# Patient Record
Sex: Female | Born: 1945 | Race: Black or African American | Hispanic: No | Smoking: Never smoker
Health system: Southern US, Community
[De-identification: ages and names within clinical notes are randomized; demographics above are authoritative.]

## PROBLEM LIST (undated history)

## (undated) DIAGNOSIS — I10 Essential (primary) hypertension: Secondary | ICD-10-CM

## (undated) DIAGNOSIS — I2699 Other pulmonary embolism without acute cor pulmonale: Secondary | ICD-10-CM

## (undated) DIAGNOSIS — I82409 Acute embolism and thrombosis of unspecified deep veins of unspecified lower extremity: Secondary | ICD-10-CM

## (undated) DIAGNOSIS — M1710 Unilateral primary osteoarthritis, unspecified knee: Secondary | ICD-10-CM

## (undated) HISTORY — PX: CHOLECYSTECTOMY: SHX55

## (undated) HISTORY — PX: BREAST SURGERY: SHX581

## (undated) HISTORY — PX: HEMORRHOID SURGERY: SHX153

---

## 2008-10-28 ENCOUNTER — Ambulatory Visit (HOSPITAL_BASED_OUTPATIENT_CLINIC_OR_DEPARTMENT_OTHER): Admission: RE | Admit: 2008-10-28 | Discharge: 2008-10-28 | Payer: Self-pay | Admitting: Internal Medicine

## 2008-10-28 ENCOUNTER — Ambulatory Visit: Payer: Self-pay | Admitting: Diagnostic Radiology

## 2009-04-15 ENCOUNTER — Ambulatory Visit: Payer: Self-pay | Admitting: Obstetrics and Gynecology

## 2009-04-16 ENCOUNTER — Encounter: Payer: Self-pay | Admitting: Obstetrics & Gynecology

## 2009-09-02 ENCOUNTER — Ambulatory Visit: Payer: Self-pay | Admitting: Diagnostic Radiology

## 2009-09-02 ENCOUNTER — Emergency Department (HOSPITAL_BASED_OUTPATIENT_CLINIC_OR_DEPARTMENT_OTHER): Admission: EM | Admit: 2009-09-02 | Discharge: 2009-09-02 | Payer: Self-pay | Admitting: Emergency Medicine

## 2010-10-28 ENCOUNTER — Other Ambulatory Visit: Payer: Self-pay | Admitting: Family Medicine

## 2010-10-28 ENCOUNTER — Encounter: Payer: Medicare Other | Admitting: Family Medicine

## 2010-10-28 DIAGNOSIS — Z124 Encounter for screening for malignant neoplasm of cervix: Secondary | ICD-10-CM

## 2010-10-28 DIAGNOSIS — B373 Candidiasis of vulva and vagina: Secondary | ICD-10-CM

## 2010-10-29 NOTE — Group Therapy Note (Signed)
NAME:  Cassandra [name], Cassandra [name] NO.:  [id number]  MEDICAL RECORD NO.:  [id number]           PATIENT TYPE:  A  LOCATION:  [facility]                   FACILITY:  [facility]  PHYSICIAN:  Tinnie Gens, MD        DATE OF BIRTH:  August 29, 1945  DATE OF SERVICE:  10/28/2010                                 CLINIC NOTE  CHIEF COMPLAINT:  Pap and vaginal irritation.  HISTORY OF PRESENT ILLNESS:  The patient is a 65 year old gravida 3, para 2-0-1-2 who is hypertensive and is self-referred.  She was previously referred in from [facility] Department after findings of cervical polyp.  This cervical polyp was not found by Dr. Okey Dupre. Today, she is reporting some vaginal dryness and irritation that has been going on for several months.  She does not take baths and she had recent blood work at her primary care physician that does not indicate she had any evidence of diabetes.  She notes no external changes except for she sometimes gets boils and she had one recently but seems to be healing well on its own.  She has noted no other pigmented changes.  She does have some mild itching related to it.  She wonders if she is in need of HRT.  PHYSICAL EXAMINATION:  VITAL SIGNS:  Today, vitals are as noted in the chart. GENERAL:  She is a well-developed, well-nourished female in no acute distress. ABDOMEN:  Soft, nontender, and nondistended. GU:  Normal external female genitalia.  BUS is normal.  Vagina is pink and rugated.  There is a thick adherent cottage cheese yellow-white discharge noted in the vagina.  The cervix appears to be menopausal and is small.  There is no evidence of polyp noted.  Uterus and adnexa exams were limited by body habitus.  IMPRESSION: 1. Probable Monilia yeast. 2. Pap smear obtained.  PLAN: 1. We will treat with Diflucan 150 mg 1 p.o. x1, repeat in 24 hours if     needed.  The patient will return as needed. 2. I have explained to the patient that at age 58, the  patient should     no longer require Pap smears as long as this was normal.  The     results will be back in several weeks and we will mail her the     results of this.          ______________________________ Tinnie Gens, MD    TP/MEDQ  D:  10/28/2010  T:  10/29/2010  Job:  [id number]

## 2011-02-12 ENCOUNTER — Emergency Department (HOSPITAL_BASED_OUTPATIENT_CLINIC_OR_DEPARTMENT_OTHER)
Admission: EM | Admit: 2011-02-12 | Discharge: 2011-02-12 | Disposition: A | Discharge status: Home / Self Care | Payer: Medicare Other | Attending: Emergency Medicine | Admitting: Emergency Medicine

## 2011-02-12 ENCOUNTER — Encounter: Payer: Self-pay | Admitting: *Deleted

## 2011-02-12 ENCOUNTER — Emergency Department (INDEPENDENT_AMBULATORY_CARE_PROVIDER_SITE_OTHER): Payer: Medicare Other

## 2011-02-12 DIAGNOSIS — M549 Dorsalgia, unspecified: Secondary | ICD-10-CM | POA: Insufficient documentation

## 2011-02-12 DIAGNOSIS — R109 Unspecified abdominal pain: Secondary | ICD-10-CM | POA: Insufficient documentation

## 2011-02-12 DIAGNOSIS — N949 Unspecified condition associated with female genital organs and menstrual cycle: Secondary | ICD-10-CM

## 2011-02-12 DIAGNOSIS — N390 Urinary tract infection, site not specified: Secondary | ICD-10-CM | POA: Insufficient documentation

## 2011-02-12 HISTORY — DX: Essential (primary) hypertension: I10

## 2011-02-12 LAB — DIFFERENTIAL
Eosinophils Absolute: 0.1 10*3/uL (ref 0.0–0.7)
Lymphs Abs: 1.4 10*3/uL (ref 0.7–4.0)
Monocytes Absolute: 0.7 10*3/uL (ref 0.1–1.0)
Neutro Abs: 5 10*3/uL (ref 1.7–7.7)

## 2011-02-12 LAB — BASIC METABOLIC PANEL
BUN: 10 mg/dL (ref 6–23)
Chloride: 100 mEq/L (ref 96–112)
Creatinine, Ser: 1 mg/dL (ref 0.50–1.10)
GFR calc Af Amer: 67 mL/min — ABNORMAL LOW (ref >90)
GFR calc non Af Amer: 57 mL/min — ABNORMAL LOW (ref >90)
Potassium: 3.5 mEq/L (ref 3.5–5.1)

## 2011-02-12 LAB — URINALYSIS, ROUTINE W REFLEX MICROSCOPIC
Ketones, ur: NEGATIVE mg/dL
Leukocytes, UA: NEGATIVE
Nitrite: NEGATIVE
pH: 6.5 (ref 5.0–8.0)

## 2011-02-12 LAB — CBC
HCT: 36.5 % (ref 36.0–46.0)
Hemoglobin: 12.6 g/dL (ref 12.0–15.0)
MCH: 30.7 pg (ref 26.0–34.0)
MCHC: 34.5 g/dL (ref 30.0–36.0)
MCV: 88.8 fL (ref 78.0–100.0)
RBC: 4.11 MIL/uL (ref 3.87–5.11)
RDW: 12.7 % (ref 11.5–15.5)
WBC: 7.1 10*3/uL (ref 4.0–10.5)

## 2011-02-12 LAB — URINE MICROSCOPIC-ADD ON

## 2011-02-12 MED ORDER — CEFTRIAXONE SODIUM 1 G IJ SOLR
1.0000 g | INTRAMUSCULAR | Status: DC
  Administered 2011-02-12: 1 g via INTRAMUSCULAR
  Filled 2011-02-12: qty 1

## 2011-02-12 MED ORDER — LIDOCAINE HCL (PF) 1 % IJ SOLN
INTRAMUSCULAR | Status: AC
  Administered 2011-02-12: 2.1 mL via INTRAMUSCULAR
  Filled 2011-02-12: qty 5

## 2011-02-12 MED ORDER — CEPHALEXIN 500 MG PO CAPS: 500.0000 mg | ORAL_CAPSULE | Freq: Four times a day (QID) | ORAL | Status: AC

## 2011-02-12 MED ORDER — OXYCODONE-ACETAMINOPHEN 5-325 MG PO TABS: 2.0000 | ORAL_TABLET | ORAL | Status: AC | PRN

## 2011-02-12 NOTE — ED Provider Notes (Signed)
History     CSN: [id number] Arrival date & time: 02/12/2011  7:58 AM  Chief Complaint  Patient presents with  . Abdominal Pain    (Consider location/radiation/quality/duration/timing/severity/associated sxs/prior treatment) Patient is a 65 y.o. [name] presenting with abdominal pain. The history is provided by the patient.  Abdominal Pain The primary symptoms of the illness include abdominal pain.   Patient here complaining of lower abdominal pain x3 days. Was seen by her physician for same diagnosed with a UTI and placed on ciprofloxacin 2 days ago. Continues to note dysuria, she denies fever or vomiting does note some flank pain. Nothing makes her symptoms better or worse. Patient's pain starts in her suprapubic region and radiates to her flank. No past medical history on file.  No past surgical history on file.  No family history on file.  History  Substance Use Topics  . Smoking status: Not on file  . Smokeless tobacco: Not on file  . Alcohol Use: Not on file    OB History    No data available      Review of Systems  Gastrointestinal: Positive for abdominal pain.  All other systems reviewed and are negative.    Allergies  Review of patient's allergies indicates not on file.  Home Medications  No current outpatient prescriptions on file.  There were no vitals taken for this visit.  Physical Exam  Nursing note and vitals reviewed. Constitutional: She is oriented to person, place, and time. She appears well-developed and well-nourished.  Non-toxic appearance.  HENT:  Head: Normocephalic and atraumatic.  Eyes: Conjunctivae are normal. Pupils are equal, round, and reactive to light.  Neck: Normal range of motion.  Cardiovascular: Normal rate.   Pulmonary/Chest: Effort normal.  Abdominal: Soft. There is tenderness in the suprapubic area. There is no rigidity, no rebound, no guarding and no CVA tenderness.  Musculoskeletal: Normal range of motion.  Neurological:  She is alert and oriented to person, place, and time.  Skin: Skin is warm and dry.  Psychiatric: She has a normal mood and affect.    ED Course  Procedures (including critical care time)   Labs Reviewed  CBC  DIFFERENTIAL  BASIC METABOLIC PANEL  URINALYSIS, ROUTINE W REFLEX MICROSCOPIC  URINE CULTURE   No results found.   No diagnosis found.    MDM  Ct Abdomen Pelvis Wo Contrast  02/12/2011  *RADIOLOGY REPORT*  Clinical Data: 65 year old [name] with right flank, back and abdominal/pelvic pain.  CT ABDOMEN AND PELVIS WITHOUT CONTRAST  Technique:  Multidetector CT imaging of the abdomen and pelvis was performed following the standard protocol without intravenous contrast.  Comparison: None  Findings: Calcifications along the posterior liver capsule are noted and probably related to prior inflammation or trauma. No other peritoneal abnormalities or masses are identified.  The spleen, pancreas and adrenal glands are unremarkable.  Mild fullness of both intrarenal collecting systems identified with mild ureteral wall thickening and adjacent inflammation.  There is no evidence of obstructing cause and this is suspicious for infection. Low density lesions within both kidneys measure fluid density likely representing cysts. There is no evidence of urinary calculi.  Cholecystectomy identified.  Please note that parenchymal abnormalities may be missed as intravenous contrast was not administered. No free fluid, enlarged lymph nodes, biliary dilation or abdominal aortic aneurysm identified. The bowel and bladder are within normal limits.  No acute or suspicious bony abnormalities are noted. Degenerative changes within both hips are present.  IMPRESSION: Fullness of both intrarenal collecting systems  with mild bilateral ureteral wall thickening and periureteral inflammation suggestive of infection.  No evidence of urinary calculi.  Original Report Authenticated By: Rosendo Gros, M.D.     Results  for orders placed during the hospital encounter of 02/12/11  CBC      Component Value Range   WBC 7.1  4.0 - 10.5 (K/uL)   RBC 4.11  3.87 - 5.11 (MIL/uL)   Hemoglobin 12.6  12.0 - 15.0 (g/dL)   HCT 40.1  02.7 - 25.3 (%)   MCV 88.8  78.0 - 100.0 (fL)   MCH 30.7  26.0 - 34.0 (pg)   MCHC 34.5  30.0 - 36.0 (g/dL)   RDW 66.4  40.3 - 47.4 (%)   Platelets 239  150 - 400 (K/uL)  DIFFERENTIAL      Component Value Range   Neutrophils Relative 70  43 - 77 (%)   Neutro Abs 5.0  1.7 - 7.7 (K/uL)   Lymphocytes Relative 19  12 - 46 (%)   Lymphs Abs 1.4  0.7 - 4.0 (K/uL)   Monocytes Relative 10  3 - 12 (%)   Monocytes Absolute 0.7  0.1 - 1.0 (K/uL)   Eosinophils Relative 1  0 - 5 (%)   Eosinophils Absolute 0.1  0.0 - 0.7 (K/uL)   Basophils Relative 0  0 - 1 (%)   Basophils Absolute 0.0  0.0 - 0.1 (K/uL)  BASIC METABOLIC PANEL      Component Value Range   Sodium 138  135 - 145 (mEq/L)   Potassium 3.5  3.5 - 5.1 (mEq/L)   Chloride 100  96 - 112 (mEq/L)   CO2 29  19 - 32 (mEq/L)   Glucose, Bld 130 (*) 70 - 99 (mg/dL)   BUN 10  6 - 23 (mg/dL)   Creatinine, Ser 2.59  0.50 - 1.10 (mg/dL)   Calcium 9.5  8.4 - 56.3 (mg/dL)   GFR calc non Af Amer 57 (*) >90 (mL/min)   GFR calc Af Amer 67 (*) >90 (mL/min)  URINALYSIS, ROUTINE W REFLEX MICROSCOPIC      Component Value Range   Color, Urine YELLOW  YELLOW    Appearance CLOUDY (*) CLEAR    Specific Gravity, Urine 1.008  1.005 - 1.030    pH 6.5  5.0 - 8.0    Glucose, UA NEGATIVE  NEGATIVE (mg/dL)   Hgb urine dipstick MODERATE (*) NEGATIVE    Bilirubin Urine NEGATIVE  NEGATIVE    Ketones, ur NEGATIVE  NEGATIVE (mg/dL)   Protein, ur 875 (*) NEGATIVE (mg/dL)   Urobilinogen, UA 0.2  0.0 - 1.0 (mg/dL)   Nitrite NEGATIVE  NEGATIVE    Leukocytes, UA NEGATIVE  NEGATIVE   URINE MICROSCOPIC-ADD ON      Component Value Range   Squamous Epithelial / LPF FEW (*) RARE    WBC, UA 0-2  <3 (WBC/hpf)   RBC / HPF 3-6  <3 (RBC/hpf)   Bacteria, UA FEW (*) RARE       Patient had an abdominal CT which showed infection of her urinary tract system. Patient denies vomiting fever here will be given Rocephin 1 g IM will change her prescription to Keflex and she will followup with her doctor as needed or return here if she is unable      Toy Baker, MD 02/12/11 [phone]

## 2011-02-12 NOTE — ED Notes (Signed)
Patient states that she went to primary MD last week for abd/lower back pain and was diagnosed with UTI, placed on cipro but still continues to experience pain, burns when she urinates

## 2011-02-13 LAB — URINE CULTURE
Colony Count: NO GROWTH
Culture  Setup Time: 201210071139
Culture: NO GROWTH

## 2011-06-09 ENCOUNTER — Other Ambulatory Visit (HOSPITAL_BASED_OUTPATIENT_CLINIC_OR_DEPARTMENT_OTHER): Payer: Self-pay | Admitting: Internal Medicine

## 2011-06-09 DIAGNOSIS — R1011 Right upper quadrant pain: Secondary | ICD-10-CM

## 2011-06-15 ENCOUNTER — Ambulatory Visit (HOSPITAL_BASED_OUTPATIENT_CLINIC_OR_DEPARTMENT_OTHER)
Admission: RE | Admit: 2011-06-15 | Discharge: 2011-06-15 | Disposition: A | Discharge status: Home / Self Care | Payer: Medicare Other | Source: Ambulatory Visit | Attending: Internal Medicine | Admitting: Internal Medicine

## 2011-06-15 DIAGNOSIS — R1011 Right upper quadrant pain: Secondary | ICD-10-CM

## 2011-06-15 MED ORDER — IOHEXOL 300 MG/ML  SOLN: 100.0000 mL | Freq: Once | INTRAMUSCULAR | Status: AC | PRN

## 2015-04-12 ENCOUNTER — Inpatient Hospital Stay (HOSPITAL_BASED_OUTPATIENT_CLINIC_OR_DEPARTMENT_OTHER)
Admission: EM | Admit: 2015-04-12 | Discharge: 2015-04-20 | DRG: 270 | Disposition: A | Discharge status: Home Health | Payer: Medicare Other | Attending: Internal Medicine | Admitting: Internal Medicine

## 2015-04-12 ENCOUNTER — Emergency Department (HOSPITAL_BASED_OUTPATIENT_CLINIC_OR_DEPARTMENT_OTHER): Payer: Medicare Other

## 2015-04-12 ENCOUNTER — Encounter (HOSPITAL_BASED_OUTPATIENT_CLINIC_OR_DEPARTMENT_OTHER): Payer: Self-pay | Admitting: Emergency Medicine

## 2015-04-12 ENCOUNTER — Ambulatory Visit (HOSPITAL_COMMUNITY): Payer: Medicare Other

## 2015-04-12 DIAGNOSIS — I749 Embolism and thrombosis of unspecified artery: Secondary | ICD-10-CM | POA: Diagnosis not present

## 2015-04-12 DIAGNOSIS — Y838 Other surgical procedures as the cause of abnormal reaction of the patient, or of later complication, without mention of misadventure at the time of the procedure: Secondary | ICD-10-CM | POA: Diagnosis not present

## 2015-04-12 DIAGNOSIS — I771 Stricture of artery: Secondary | ICD-10-CM

## 2015-04-12 DIAGNOSIS — R609 Edema, unspecified: Secondary | ICD-10-CM | POA: Diagnosis present

## 2015-04-12 DIAGNOSIS — D62 Acute posthemorrhagic anemia: Secondary | ICD-10-CM | POA: Diagnosis not present

## 2015-04-12 DIAGNOSIS — Q211 Atrial septal defect: Secondary | ICD-10-CM | POA: Diagnosis not present

## 2015-04-12 DIAGNOSIS — Z8249 Family history of ischemic heart disease and other diseases of the circulatory system: Secondary | ICD-10-CM

## 2015-04-12 DIAGNOSIS — I509 Heart failure, unspecified: Secondary | ICD-10-CM | POA: Diagnosis not present

## 2015-04-12 DIAGNOSIS — N189 Chronic kidney disease, unspecified: Secondary | ICD-10-CM | POA: Diagnosis present

## 2015-04-12 DIAGNOSIS — Z79899 Other long term (current) drug therapy: Secondary | ICD-10-CM | POA: Diagnosis not present

## 2015-04-12 DIAGNOSIS — I10 Essential (primary) hypertension: Secondary | ICD-10-CM | POA: Diagnosis present

## 2015-04-12 DIAGNOSIS — I97638 Postprocedural hematoma of a circulatory system organ or structure following other circulatory system procedure: Secondary | ICD-10-CM | POA: Diagnosis not present

## 2015-04-12 DIAGNOSIS — Z833 Family history of diabetes mellitus: Secondary | ICD-10-CM | POA: Diagnosis not present

## 2015-04-12 DIAGNOSIS — I272 Other secondary pulmonary hypertension: Secondary | ICD-10-CM | POA: Diagnosis present

## 2015-04-12 DIAGNOSIS — E876 Hypokalemia: Secondary | ICD-10-CM | POA: Diagnosis present

## 2015-04-12 DIAGNOSIS — R52 Pain, unspecified: Secondary | ICD-10-CM

## 2015-04-12 DIAGNOSIS — I50811 Acute right heart failure: Secondary | ICD-10-CM

## 2015-04-12 DIAGNOSIS — M179 Osteoarthritis of knee, unspecified: Secondary | ICD-10-CM | POA: Diagnosis present

## 2015-04-12 DIAGNOSIS — N179 Acute kidney failure, unspecified: Secondary | ICD-10-CM | POA: Diagnosis present

## 2015-04-12 DIAGNOSIS — I7092 Chronic total occlusion of artery of the extremities: Secondary | ICD-10-CM | POA: Diagnosis not present

## 2015-04-12 DIAGNOSIS — M6289 Other specified disorders of muscle: Secondary | ICD-10-CM | POA: Diagnosis not present

## 2015-04-12 DIAGNOSIS — I82432 Acute embolism and thrombosis of left popliteal vein: Principal | ICD-10-CM | POA: Diagnosis present

## 2015-04-12 DIAGNOSIS — I2699 Other pulmonary embolism without acute cor pulmonale: Secondary | ICD-10-CM | POA: Diagnosis present

## 2015-04-12 DIAGNOSIS — I97621 Postprocedural hematoma of a circulatory system organ or structure following other procedure: Secondary | ICD-10-CM | POA: Diagnosis not present

## 2015-04-12 DIAGNOSIS — I739 Peripheral vascular disease, unspecified: Secondary | ICD-10-CM | POA: Diagnosis present

## 2015-04-12 DIAGNOSIS — Z6841 Body Mass Index (BMI) 40.0 and over, adult: Secondary | ICD-10-CM | POA: Diagnosis not present

## 2015-04-12 DIAGNOSIS — I420 Dilated cardiomyopathy: Secondary | ICD-10-CM | POA: Diagnosis present

## 2015-04-12 DIAGNOSIS — I745 Embolism and thrombosis of iliac artery: Secondary | ICD-10-CM | POA: Diagnosis present

## 2015-04-12 DIAGNOSIS — R739 Hyperglycemia, unspecified: Secondary | ICD-10-CM | POA: Diagnosis present

## 2015-04-12 DIAGNOSIS — Z419 Encounter for procedure for purposes other than remedying health state, unspecified: Secondary | ICD-10-CM

## 2015-04-12 DIAGNOSIS — I2609 Other pulmonary embolism with acute cor pulmonale: Secondary | ICD-10-CM | POA: Diagnosis not present

## 2015-04-12 DIAGNOSIS — I998 Other disorder of circulatory system: Secondary | ICD-10-CM | POA: Diagnosis not present

## 2015-04-12 HISTORY — DX: Unilateral primary osteoarthritis, unspecified knee: M17.10

## 2015-04-12 LAB — BASIC METABOLIC PANEL
ANION GAP: 12 (ref 5–15)
BUN: 14 mg/dL (ref 6–20)
CALCIUM: 8.8 mg/dL — AB (ref 8.9–10.3)
CHLORIDE: 101 mmol/L (ref 101–111)
CO2: 20 mmol/L — AB (ref 22–32)
CREATININE: 1.36 mg/dL — AB (ref 0.44–1.00)
GFR calc non Af Amer: 38 mL/min — ABNORMAL LOW (ref >60)
GFR, EST AFRICAN AMERICAN: 45 mL/min — AB (ref >60)
Glucose, Bld: 185 mg/dL — ABNORMAL HIGH (ref 65–99)
Potassium: 3.2 mmol/L — ABNORMAL LOW (ref 3.5–5.1)
SODIUM: 133 mmol/L — AB (ref 135–145)

## 2015-04-12 LAB — TROPONIN I
TROPONIN I: 0.09 ng/mL — AB (ref <0.031)
TROPONIN I: 0.07 ng/mL — AB (ref <0.031)

## 2015-04-12 LAB — CBC WITH DIFFERENTIAL/PLATELET
BASOS ABS: 0 10*3/uL (ref 0.0–0.1)
BASOS PCT: 0 %
EOS ABS: 0.1 10*3/uL (ref 0.0–0.7)
Eosinophils Relative: 1 %
HEMATOCRIT: 36.1 % (ref 36.0–46.0)
HEMOGLOBIN: 12.4 g/dL (ref 12.0–15.0)
Lymphocytes Relative: 14 %
Lymphs Abs: 1.4 10*3/uL (ref 0.7–4.0)
MCH: 30.8 pg (ref 26.0–34.0)
MCHC: 34.3 g/dL (ref 30.0–36.0)
MCV: 89.6 fL (ref 78.0–100.0)
MONOS PCT: 8 %
Monocytes Absolute: 0.8 10*3/uL (ref 0.1–1.0)
NEUTROS ABS: 7.6 10*3/uL (ref 1.7–7.7)
NEUTROS PCT: 77 %
Platelets: 141 10*3/uL — ABNORMAL LOW (ref 150–400)
RBC: 4.03 MIL/uL (ref 3.87–5.11)
RDW: 13.6 % (ref 11.5–15.5)
WBC: 9.9 10*3/uL (ref 4.0–10.5)

## 2015-04-12 LAB — GLUCOSE, CAPILLARY
Glucose-Capillary: 103 mg/dL — ABNORMAL HIGH (ref 65–99)
Glucose-Capillary: 137 mg/dL — ABNORMAL HIGH (ref 65–99)
Glucose-Capillary: 95 mg/dL (ref 65–99)

## 2015-04-12 LAB — PHOSPHORUS: PHOSPHORUS: 3.3 mg/dL (ref 2.5–4.6)

## 2015-04-12 LAB — HEPARIN LEVEL (UNFRACTIONATED): HEPARIN UNFRACTIONATED: 1.16 [IU]/mL — AB (ref 0.30–0.70)

## 2015-04-12 LAB — D-DIMER, QUANTITATIVE (NOT AT ARMC): D DIMER QUANT: 14.52 ug{FEU}/mL — AB (ref 0.00–0.50)

## 2015-04-12 LAB — MAGNESIUM: MAGNESIUM: 1.5 mg/dL — AB (ref 1.7–2.4)

## 2015-04-12 LAB — MRSA PCR SCREENING: MRSA BY PCR: NEGATIVE

## 2015-04-12 MED ORDER — SODIUM CHLORIDE 0.9 % IJ SOLN
3.0000 mL | Freq: Two times a day (BID) | INTRAMUSCULAR | Status: DC
  Administered 2015-04-12 – 2015-04-20 (×10): 3 mL via INTRAVENOUS

## 2015-04-12 MED ORDER — HEPARIN (PORCINE) IN NACL 100-0.45 UNIT/ML-% IJ SOLN
700.0000 [IU]/h | INTRAMUSCULAR | Status: DC
  Administered 2015-04-12: 1000 [IU]/h via INTRAVENOUS
  Filled 2015-04-12: qty 250

## 2015-04-12 MED ORDER — ONDANSETRON HCL 4 MG/2ML IJ SOLN: 4.0000 mg | Freq: Four times a day (QID) | INTRAMUSCULAR | Status: DC | PRN

## 2015-04-12 MED ORDER — MORPHINE SULFATE (PF) 4 MG/ML IV SOLN
4.0000 mg | Freq: Once | INTRAVENOUS | Status: AC
  Administered 2015-04-12: 4 mg via INTRAVENOUS
  Filled 2015-04-12: qty 1

## 2015-04-12 MED ORDER — CETYLPYRIDINIUM CHLORIDE 0.05 % MT LIQD
7.0000 mL | Freq: Two times a day (BID) | OROMUCOSAL | Status: DC
  Administered 2015-04-12 – 2015-04-20 (×13): 7 mL via OROMUCOSAL

## 2015-04-12 MED ORDER — BENAZEPRIL HCL 20 MG PO TABS
40.0000 mg | ORAL_TABLET | Freq: Every day | ORAL | Status: DC
  Administered 2015-04-12 – 2015-04-13 (×2): 40 mg via ORAL
  Filled 2015-04-12 (×7): qty 2

## 2015-04-12 MED ORDER — HEPARIN BOLUS VIA INFUSION
4000.0000 [IU] | Freq: Once | INTRAVENOUS | Status: AC
  Administered 2015-04-12: 4000 [IU] via INTRAVENOUS

## 2015-04-12 MED ORDER — OXYCODONE-ACETAMINOPHEN 5-325 MG PO TABS
1.0000 | ORAL_TABLET | Freq: Once | ORAL | Status: AC
  Administered 2015-04-12: 1 via ORAL
  Filled 2015-04-12: qty 1

## 2015-04-12 MED ORDER — ACETAMINOPHEN 650 MG RE SUPP: 650.0000 mg | Freq: Four times a day (QID) | RECTAL | Status: DC | PRN

## 2015-04-12 MED ORDER — MAGNESIUM SULFATE 2 GM/50ML IV SOLN
2.0000 g | Freq: Once | INTRAVENOUS | Status: AC
  Administered 2015-04-12: 2 g via INTRAVENOUS
  Filled 2015-04-12: qty 50

## 2015-04-12 MED ORDER — SODIUM CHLORIDE 0.9 % IV SOLN
INTRAVENOUS | Status: DC
  Administered 2015-04-12 – 2015-04-13 (×2): 75 mL/h via INTRAVENOUS
  Administered 2015-04-13 – 2015-04-16 (×6): via INTRAVENOUS

## 2015-04-12 MED ORDER — ONDANSETRON HCL 4 MG PO TABS: 4.0000 mg | ORAL_TABLET | Freq: Four times a day (QID) | ORAL | Status: DC | PRN

## 2015-04-12 MED ORDER — SODIUM CHLORIDE 0.9 % IV BOLUS (SEPSIS)
1000.0000 mL | Freq: Once | INTRAVENOUS | Status: AC
  Administered 2015-04-12: 1000 mL via INTRAVENOUS

## 2015-04-12 MED ORDER — OXYCODONE HCL 5 MG PO TABS
5.0000 mg | ORAL_TABLET | ORAL | Status: DC | PRN
  Administered 2015-04-13 – 2015-04-14 (×3): 5 mg via ORAL
  Filled 2015-04-12 (×4): qty 1

## 2015-04-12 MED ORDER — IOHEXOL 350 MG/ML SOLN
100.0000 mL | Freq: Once | INTRAVENOUS | Status: AC | PRN
  Administered 2015-04-12: 100 mL via INTRAVENOUS

## 2015-04-12 MED ORDER — HEPARIN (PORCINE) IN NACL 100-0.45 UNIT/ML-% IJ SOLN
1250.0000 [IU]/h | INTRAMUSCULAR | Status: DC
  Administered 2015-04-12: 1250 [IU]/h via INTRAVENOUS
  Filled 2015-04-12: qty 250

## 2015-04-12 MED ORDER — ACETAMINOPHEN 325 MG PO TABS
650.0000 mg | ORAL_TABLET | Freq: Four times a day (QID) | ORAL | Status: DC | PRN
  Administered 2015-04-20: 650 mg via ORAL
  Filled 2015-04-12: qty 2

## 2015-04-12 MED ORDER — POTASSIUM CHLORIDE CRYS ER 20 MEQ PO TBCR
40.0000 meq | EXTENDED_RELEASE_TABLET | Freq: Two times a day (BID) | ORAL | Status: DC
  Administered 2015-04-12 – 2015-04-14 (×5): 40 meq via ORAL
  Filled 2015-04-12 (×5): qty 2

## 2015-04-12 NOTE — ED Notes (Signed)
MD at bedside. 

## 2015-04-12 NOTE — ED Notes (Signed)
Patient transported to CT 

## 2015-04-12 NOTE — Progress Notes (Signed)
ANTICOAGULATION CONSULT NOTE - Initial Consult  Pharmacy Consult for heparin Indication: pulmonary embolus  No Known Allergies  Patient Measurements: Height: 5\' 3"  (160 cm) Weight: 240 lb (108.863 kg) IBW/kg (Calculated) : 52.4 Heparin Dosing Weight: 85kg  Vital Signs: Temp: 98.6 F (37 C) (12/05 0129) Temp Source: Oral (12/05 0129) BP: 124/82 mmHg (12/05 0400) Pulse Rate: 76 (12/05 0400)  Labs:  Recent Labs  04/12/15 0230  HGB 12.4  HCT 36.1  PLT 141*  CREATININE 1.36*    Estimated Creatinine Clearance: 45.6 mL/min (by C-G formula based on Cr of 1.36).   Medical History: Past Medical History  Diagnosis Date  . Hypertension     Assessment: 69yo female c/o LLE/hip pain/numbness and SOB x3d, CXR at PCP was negative, D-dimer found to be elevated, CT shows multiple proximal blood clots per EDP, to begin heparin.  Goal of Therapy:  Heparin level 0.3-0.7 units/ml Monitor platelets by anticoagulation protocol: Yes   Plan:  Will give heparin 4000 units IV bolus x1 followed by gtt at 1250 units/hr and monitor heparin levels and CBC.  Vernard GamblesVeronda Naje Rice, PharmD, BCPS  04/12/2015,4:33 AM

## 2015-04-12 NOTE — ED Provider Notes (Addendum)
CSN: [id number]     Arrival date & time 04/12/15  0116 History   First MD Initiated Contact with Patient 04/12/15 0134     Chief Complaint  Patient presents with  . Leg Pain     (Consider location/radiation/quality/duration/timing/severity/associated sxs/prior Treatment) HPI  This is a 69 year old female who presents with left leg pain. Patient reports onset of symptoms 3 hours prior to arrival. She states that the pain started when she got up from the couch to go to bed. She reports left hip pain that radiates down to her left foot. She reports numbness in her left foot. She denies any back pain. Never had pain like this in the past. She took ibuprofen without relief. Currently her pain is 10 out of 10. It is worse with ambulation. Denies any difficulty with her bowel or bladder. Denies any history of cancer, fevers, steroid use.  She has not noted any swelling or skin changes.  Patient initially denied additional symptoms. However, upon my exit, she reported to the nurse that she's had 3 days of shortness of breath. Denies fever or cough. Denies chest pain. Reportedly had a normal x-ray and negative echo by her primary physician.  No long recent travel, history of blood clots, history of cancer.  Past Medical History  Diagnosis Date  . Hypertension    Past Surgical History  Procedure Laterality Date  . Cholecystectomy    . Hemorrhoid surgery    . Breast surgery      lumpectomy on Right breast   No family history on file. Social History  Substance Use Topics  . Smoking status: Never Smoker   . Smokeless tobacco: None  . Alcohol Use: No   OB History    No data available     Review of Systems  Constitutional: Negative for fever.  Respiratory: Positive for shortness of breath. Negative for cough.   Cardiovascular: Negative for chest pain.  Gastrointestinal: Negative for abdominal pain.  Genitourinary: Negative for difficulty urinating.  Musculoskeletal: Negative for back  pain.       Left leg pain  Skin: Negative for wound.  Neurological: Positive for numbness.  All other systems reviewed and are negative.     Allergies  Review of patient's allergies indicates no known allergies.  Home Medications   Prior to Admission medications   Medication Sig Start Date End Date Taking? Authorizing Provider  benazepril (LOTENSIN) 40 MG tablet Take 40 mg by mouth daily.      Historical Provider, MD  furosemide (LASIX) 40 MG tablet Take 40 mg by mouth daily.      Historical Provider, MD   BP 124/82 mmHg  Pulse 76  Temp(Src) 98.6 F (37 C) (Oral)  Resp 17  Ht 5\' 3"  (1.6 m)  Wt 240 lb (108.863 kg)  BMI 42.52 kg/m2  SpO2 94% Physical Exam  Constitutional: She is oriented to person, place, and time. She appears well-developed and well-nourished. No distress.  HENT:  Head: Normocephalic and atraumatic.  Cardiovascular: Normal rate and regular rhythm.   No murmur heard. Pulmonary/Chest: Effort normal and breath sounds normal. No respiratory distress. She has no wheezes.  Abdominal: Soft. There is no tenderness.  Musculoskeletal: She exhibits no edema.  No obvious deformities, normal range of motion of the left hip and knee, normal range of motion in the ankle and toes, pain with elevation of left leg  Neurological: She is alert and oriented to person, place, and time.  5 out of 5 strength with  plantar and dorsiflexion and hip flexion of the bilateral lower extremities, no clonus, normal reflexes, subjective decreased sensation left plantar aspect of foot  Skin: Skin is warm and dry.  Psychiatric: She has a normal mood and affect.  Nursing note and vitals reviewed.   ED Course  Procedures (including critical care time)  CRITICAL CARE Performed by: Shon Baton   Total critical care time: 30 minutes  Critical care time was exclusive of separately billable procedures and treating other patients.  Critical care was necessary to treat or prevent  imminent or life-threatening deterioration.  Critical care was time spent personally by me on the following activities: development of treatment plan with patient and/or surrogate as well as nursing, discussions with consultants, evaluation of patient's response to treatment, examination of patient, obtaining history from patient or surrogate, ordering and performing treatments and interventions, ordering and review of laboratory studies, ordering and review of radiographic studies, pulse oximetry and re-evaluation of patient's condition.  Labs Review Labs Reviewed  CBC WITH DIFFERENTIAL/PLATELET - Abnormal; Notable for the following:    Platelets 141 (*)    All other components within normal limits  BASIC METABOLIC PANEL - Abnormal; Notable for the following:    Sodium 133 (*)    Potassium 3.2 (*)    CO2 20 (*)    Glucose, Bld 185 (*)    Creatinine, Ser 1.36 (*)    Calcium 8.8 (*)    GFR calc non Af Amer 38 (*)    GFR calc Af Amer 45 (*)    All other components within normal limits  D-DIMER, QUANTITATIVE (NOT AT [facility]) - Abnormal; Notable for the following:    D-Dimer, Quant 14.52 (*)    All other components within normal limits    Imaging Review Dg Lumbar Spine Complete  04/12/2015  CLINICAL DATA:  Pain radiating to LEFT leg with numbness, no injury. EXAM: LUMBAR SPINE - COMPLETE 4+ VIEW COMPARISON:  CT abdomen and pelvis June 15, 2011 FINDINGS: Lumbar vertebral bodies are intact and aligned with maintenance of the lumbar lordosis. Intervertebral disc heights are normal. No destructive bony lesions. No pars interarticularis defects. Moderate to severe lower lumbar facet arthropathy. Sacroiliac joints are symmetric. Included prevertebral and paraspinal soft tissue planes are non-suspicious. Surgical clips in the included right abdomen compatible with cholecystectomy. Phleboliths project in the pelvis. IMPRESSION: No acute fracture deformity or malalignment. Moderate to severe lower  lumbar facet arthropathy. Electronically Signed   By: Awilda Metro M.D.   On: 04/12/2015 02:34   Ct Angio Chest Pe W/cm &/or Wo Cm  04/12/2015  CLINICAL DATA:  43-year-old female with pain in the left leg and shortness of breath x3 days. EXAM: CT ANGIOGRAPHY CHEST WITH CONTRAST TECHNIQUE: Multidetector CT imaging of the chest was performed using the standard protocol during bolus administration of intravenous contrast. Multiplanar CT image reconstructions and MIPs were obtained to evaluate the vascular anatomy. CONTRAST:  OMNIPAQUE IOHEXOL 350 MG/ML SOLN COMPARISON:  Chest radiograph dated 04/08/2015 FINDINGS: The lungs are clear. There is no pleural effusion or pneumothorax. The central airways are patent. The thoracic aorta is unremarkable. There are large bilateral central and lobar pulmonary artery emboli. There is mild dilatation of the main pulmonary trunk as well as dilatation of the right cardiac chambers compatible with a degree of right heart strain. There is no cardiomegaly or pericardial effusion. No hilar or mediastinal adenopathy. The visualized esophagus and thyroid gland are grossly unremarkable. There is no axillary adenopathy. Right chest  wall subcutaneous calcified nodules likely related to an old insult. There is mild degenerative changes of the spine. No acute fracture. A 2.5 cm partially visualized left renal cyst all seen on the prior study. There is stable calcification of the posterior aspect of the right lobe of the liver. Review of the MIP images confirms the above findings. IMPRESSION: Large bilateral central and lobar pulmonary artery emboli with findings concerning for a degree of right cardiac straining. Critical Value/emergent results were called by telephone at the time of interpretation on 04/12/2015 at 4:28 am to Nurse Katherine Roan who verbally acknowledged these results. Electronically Signed   By: Elgie Collard M.D.   On: 04/12/2015 04:36   Dg Hip Unilat With Pelvis 1v  Left  04/12/2015  CLINICAL DATA:  Pain radiating to LEFT leg with numbness, no injury. EXAM: DG HIP (WITH OR WITHOUT PELVIS) 1V*L* COMPARISON:  None. FINDINGS: There is no evidence of hip fracture or dislocation. Moderate RIGHT hip joint space narrowing and femoral head spurring consistent with osteoarthrosis, very mild on the LEFT. Phleboliths and surgical clips project in the pelvis. IMPRESSION: No acute fracture deformity or dislocation. Moderate RIGHT, mild LEFT hip osteoarthrosis. Electronically Signed   By: Awilda Metro M.D.   On: 04/12/2015 02:25   I have personally reviewed and evaluated these images and lab results as part of my medical decision-making.   EKG Interpretation   Date/Time:  Monday April 12 2015 02:30:07 EST Ventricular Rate:  90 PR Interval:  182 QRS Duration: 86 QT Interval:  410 QTC Calculation: 501 R Axis:   -88 Text Interpretation:  Normal sinus rhythm Low voltage QRS Left anterior  fascicular block Possible Anterolateral infarct , age undetermined  Abnormal ECG no prior for comparison Confirmed by HORTON  MD, COURTNEY  ([id number]) on 04/12/2015 2:34:25 AM      MDM   Final diagnoses:  Other acute pulmonary embolism without acute cor pulmonale (HCC)    Patient presents with left leg pain. Somewhat acute in onset. Currently denies 10. Patient nontoxic and nonfocal. Initially symptoms sound sciatic in nature. However, now with shortness of breath, blood clot would be in the differential. She's fairly low risk; d-dimer was sent. During workup, patient did drop her O2 sats into the high 80s. She is a nonsmoker and no history of COPD. She is placed on supplemental oxygen. Basic labwork obtained. D-dimer is elevated at 14. Patient's creatinine is mildly elevated but her GFR is 48. Will obtain CT scan and give patient fluids. She is comfortable multiple rechecks. I reviewed the CT scan myself. Patient has multiple proximal blood clots. Heparin drip initiated. Cause  at this time unknown. Will admit to the hospitalist for further management.      Shon Baton, MD 04/12/15 [phone]  CT report with multiple proximal clots and findings concerning for right heart strain. Patient is hemodynamically stable. Heparin drip initiated.   Shon Baton, MD 04/12/15 [phone]

## 2015-04-12 NOTE — H&P (Signed)
[facility] History and Physical  Cassandra JennyHelen H Molchan MVH:[id number] DOB: 1946/02/13 DOA: 04/12/2015  Referring physician: Dr Willaim RayasHorton - [facility] PCP: Pcp Not In System   Chief Complaint: Leg pain and SOB.    HPI: Cassandra JennyHelen H [name] is a 69 y.o. female  Leg pain. L leg. Started around 23:00 on 04/11/15. Acute onset when ambulating from chair to bed. L hip radiates down to foot. Associated w/ numbness in the foot but denies back pain. Ibuprofen w/o relief. Worse w/ ambulation. Constant. No loss of bowel or bladder function or saddle anesthesia. No recent changes to medications. Associated, SOB x3 days but no CP, cough, fevers, palpitations. Recent   Echo and CXR from PCP a couple days ago were nml.     Review of Systems:  Constitutional:  No weight loss, night sweats, Fevers, chills, fatigue.  HEENT:  No headaches, Difficulty swallowing,Tooth/dental problems,Sore throat, Cardio-vascular:  No chest pain, Orthopnea, PND, swelling in lower extremities, anasarca, dizziness, palpitations  GI:  No heartburn, indigestion, abdominal pain, nausea, vomiting, diarrhea, change in bowel habits, loss of appetite  Resp: Per HPI Skin:  no rash or lesions.  GU:  no dysuria, change in color of urine, no urgency or frequency. No flank pain.  Musculoskeletal:  Per HPI Psych:  No change in mood or affect. No depression or anxiety. No memory loss.  Neuro:  No change in sensation, unilateral strength, or cognitive abilities  All other systems were reviewed and are negative.  Past Medical History  Diagnosis Date  . Hypertension   . Arthritis of knee    Past Surgical History  Procedure Laterality Date  . Cholecystectomy    . Hemorrhoid surgery    . Breast surgery      lumpectomy on Right breast   Social History:  reports that she has never smoked. She does not have any smokeless tobacco history on file. She reports that she does not drink alcohol or use illicit drugs.  No Known Allergies  Family  History  Problem Relation Age of Onset  . Hypertension Father   . Diabetes Brother      Prior to Admission medications   Medication Sig Start Date End Date Taking? Authorizing Provider  benazepril (LOTENSIN) 40 MG tablet Take 40 mg by mouth daily.      Historical Provider, MD  furosemide (LASIX) 40 MG tablet Take 40 mg by mouth daily.      Historical Provider, MD   Physical Exam: Filed Vitals:   04/12/15 [id number] 04/12/15 0640 04/12/15 0745 04/12/15 0800  BP:  123/70 110/61 110/61  Pulse:   97 68  Temp: 98.2 F (36.8 C)  98.7 F (37.1 C)   TempSrc: Oral  Oral   Resp: 18 21 18 15   Height: 5\' 3"  (1.6 m)     Weight: 109.8 kg (242 lb 1 oz)     SpO2:  93% 97% 96%    Wt Readings from Last 3 Encounters:  04/12/15 109.8 kg (242 lb 1 oz)    General:  Appears calm and comfortable Eyes:  PERRL, EOMI, normal lids, iris ENT:  grossly normal hearing, lips & tongue Neck:  no LAD, masses or thyromegaly Cardiovascular:  RRR, no m/r/g. No LE edema.  Respiratory:  CTA bilaterally, no w/r/r. Normal respiratory effort. Abdomen:  soft, ntnd Skin:  no rash or induration seen on limited exam Musculoskeletal:  grossly normal tone BUE/BLE Psychiatric:  grossly normal mood and affect, speech fluent and appropriate Neurologic:  CN 2-12 grossly intact,  moves all extremities in coordinated fashion.          Labs on Admission:  Basic Metabolic Panel:  Recent Labs Lab 04/12/15 0230  NA 133*  K 3.2*  CL 101  CO2 20*  GLUCOSE 185*  BUN 14  CREATININE 1.36*  CALCIUM 8.8*   Liver Function Tests: No results for input(s): AST, ALT, ALKPHOS, BILITOT, PROT, ALBUMIN in the last 168 hours. No results for input(s): LIPASE, AMYLASE in the last 168 hours. No results for input(s): AMMONIA in the last 168 hours. CBC:  Recent Labs Lab 04/12/15 0230  WBC 9.9  NEUTROABS 7.6  HGB 12.4  HCT 36.1  MCV 89.6  PLT 141*   Cardiac Enzymes: No results for input(s): CKTOTAL, CKMB, CKMBINDEX, TROPONINI in  the last 168 hours.  BNP (last 3 results) No results for input(s): BNP in the last 8760 hours.  ProBNP (last 3 results) No results for input(s): PROBNP in the last 8760 hours.   CREATININE: 1.36 mg/dL ABNORMAL (16/10/96 0454) Estimated creatinine clearance - 45.8 mL/min  CBG: No results for input(s): GLUCAP in the last 168 hours.  Radiological Exams on Admission: Dg Lumbar Spine Complete  04/12/2015  CLINICAL DATA:  Pain radiating to LEFT leg with numbness, no injury. EXAM: LUMBAR SPINE - COMPLETE 4+ VIEW COMPARISON:  CT abdomen and pelvis June 15, 2011 FINDINGS: Lumbar vertebral bodies are intact and aligned with maintenance of the lumbar lordosis. Intervertebral disc heights are normal. No destructive bony lesions. No pars interarticularis defects. Moderate to severe lower lumbar facet arthropathy. Sacroiliac joints are symmetric. Included prevertebral and paraspinal soft tissue planes are non-suspicious. Surgical clips in the included right abdomen compatible with cholecystectomy. Phleboliths project in the pelvis. IMPRESSION: No acute fracture deformity or malalignment. Moderate to severe lower lumbar facet arthropathy. Electronically Signed   By: Awilda Metro M.D.   On: 04/12/2015 02:34   Ct Angio Chest Pe W/cm &/or Wo Cm  04/12/2015  CLINICAL DATA:  38-year-old female with pain in the left leg and shortness of breath x3 days. EXAM: CT ANGIOGRAPHY CHEST WITH CONTRAST TECHNIQUE: Multidetector CT imaging of the chest was performed using the standard protocol during bolus administration of intravenous contrast. Multiplanar CT image reconstructions and MIPs were obtained to evaluate the vascular anatomy. CONTRAST:  OMNIPAQUE IOHEXOL 350 MG/ML SOLN COMPARISON:  Chest radiograph dated 04/08/2015 FINDINGS: The lungs are clear. There is no pleural effusion or pneumothorax. The central airways are patent. The thoracic aorta is unremarkable. There are large bilateral central and lobar  pulmonary artery emboli. There is mild dilatation of the main pulmonary trunk as well as dilatation of the right cardiac chambers compatible with a degree of right heart strain. There is no cardiomegaly or pericardial effusion. No hilar or mediastinal adenopathy. The visualized esophagus and thyroid gland are grossly unremarkable. There is no axillary adenopathy. Right chest wall subcutaneous calcified nodules likely related to an old insult. There is mild degenerative changes of the spine. No acute fracture. A 2.5 cm partially visualized left renal cyst all seen on the prior study. There is stable calcification of the posterior aspect of the right lobe of the liver. Review of the MIP images confirms the above findings. IMPRESSION: Large bilateral central and lobar pulmonary artery emboli with findings concerning for a degree of right cardiac straining. Critical Value/emergent results were called by telephone at the time of interpretation on 04/12/2015 at 4:28 am to Nurse Katherine Roan who verbally acknowledged these results. Electronically Signed   By: Burtis Junes  Radparvar M.D.   On: 04/12/2015 04:36   Dg Hip Unilat With Pelvis 1v Left  04/12/2015  CLINICAL DATA:  Pain radiating to LEFT leg with numbness, no injury. EXAM: DG HIP (WITH OR WITHOUT PELVIS) 1V*L* COMPARISON:  None. FINDINGS: There is no evidence of hip fracture or dislocation. Moderate RIGHT hip joint space narrowing and femoral head spurring consistent with osteoarthrosis, very mild on the LEFT. Phleboliths and surgical clips project in the pelvis. IMPRESSION: No acute fracture deformity or dislocation. Moderate RIGHT, mild LEFT hip osteoarthrosis. Electronically Signed   By: Awilda Metro M.D.   On: 04/12/2015 02:25      Assessment/Plan Active Problems:   Pulmonary embolism (HCC)   Hyperglycemia   AKI (acute kidney injury) (HCC)   Hypokalemia   Essential hypertension   Dependent edema   Pulmonary embolism: CT scan showing large bilateral  central and lobar PE, with right cardiac strain. D-dimer 14.52. PESI class I. Started on heparin in the ED. Discussed anticoagulation options for outpt f/u. Hemodynamically stable. Discussed long term anticoagulation w/ pt and she is leaning towards Xarelto. Pt w/ worsening limited daily ambulation due to R hip pain/arthritis, no sign of malignancy at this time, ?pre-existing hypercoagulable state, and non-smoker.  - Tele (pt admitted to stepdown prior to evaluation) - continue Heparin - Start other form of anticoagulation on 04/13/15 - pt considering options at this time but likely to go with Xarelto. - Initiate hypercoagulation workup once off anticoagulation in 3-6 mo - protein C, Protein S, antithrombin, Factor V, prothrombin - O2 PRN comfort - Bilat LE dopplers to assess extent of DVT.  - Trop - repeat Echo  AKI vs CKD: Cr 1.36. Previous 1.0 in 2012.  - IVF - BMET in am - continue ACEi for now  Hyperglycemia: 185 on admission. No h/o DM. - A1c - CBG monitoring  HypoK: 3.2. - Mag - Kdur  HTN: - continue benazepril  Dependent edema: no h/o CHF (Echo 04/09/15 nml - not in EPIC) - continue Lasix (consider DC as outpt as not standard of care - diet, exercise, wt loss, compression stockings) - consider compression stockings after resolution of DVT  Code Status: FULL  DVT Prophylaxis: Heparin drip - PE Family Communication: none Disposition Plan: Pending Improvement    MERRELL, DAVID Shela Commons, MD Family Medicine [facility] [email or web address] Password [email or web address]

## 2015-04-12 NOTE — Progress Notes (Signed)
EKG CRITICAL VALUE     12 lead EKG performed.  Critical value noted.  Marcellina MillinMorin A, RN notified.   Oda Coganiara S Woodard, CCT 04/12/2015 1:37 PM

## 2015-04-12 NOTE — ED Notes (Signed)
Pt placed on 2L [place] due to oxygen saturation of 88-91%. Sat increased to 99%. No distress noted.

## 2015-04-12 NOTE — Care Management Note (Addendum)
Case Management Note  Patient Details  Name: Dolphus JennyHelen H Cake MRN: [id number] Date of Birth: [birth year]  Subjective/Objective:     Adm w pul embolus              Action/Plan: lives alone   Expected Discharge Date:                 Expected Discharge Plan:     In-House Referral:     Discharge planning Services     Post Acute Care Choice:    Choice offered to:     DME Arranged:    DME Agency:     HH Arranged:    HH Agency:     Status of Service:     Medicare Important Message Given:    Date Medicare IM Given:    Medicare IM give by:    Date Additional Medicare IM Given:    Additional Medicare Important Message give by:     If discussed at Long Length of Stay Meetings, dates discussed:    Additional Comments: ur review done  Hanley HaysDowell, Wylee Dorantes T, RN 04/12/2015, 8:41 AM

## 2015-04-12 NOTE — ED Notes (Addendum)
Patient reports pain in left leg starting at posterior hip and going down her leg. Also states her left leg is numb. No injury that she remembers. Pt reports SOB x 3 days, states she had a CXR done at her PCP and it was negative, and a negative Echo.

## 2015-04-12 NOTE — Plan of Care (Signed)
Was called for admission by Dr. Wilkie AyeHorton. Ms. Cassandra [name], 69 year old female presented with lower extremity pain and shortness of breath. D-dimer was found to be elevated and CT angiogram of the chest shows bilateral pulmonary embolism with large clot burden as per the CAT scan reports. As per the ER physician patient is hemodynamically stable and was started on heparin infusion. Patient will be admitted to stepdown unit for further management.  Cassandra [name].

## 2015-04-13 ENCOUNTER — Ambulatory Visit (HOSPITAL_COMMUNITY): Payer: Medicare Other

## 2015-04-13 DIAGNOSIS — R609 Edema, unspecified: Secondary | ICD-10-CM

## 2015-04-13 DIAGNOSIS — N179 Acute kidney failure, unspecified: Secondary | ICD-10-CM

## 2015-04-13 DIAGNOSIS — I2699 Other pulmonary embolism without acute cor pulmonale: Secondary | ICD-10-CM

## 2015-04-13 DIAGNOSIS — R739 Hyperglycemia, unspecified: Secondary | ICD-10-CM

## 2015-04-13 DIAGNOSIS — E876 Hypokalemia: Secondary | ICD-10-CM

## 2015-04-13 DIAGNOSIS — I10 Essential (primary) hypertension: Secondary | ICD-10-CM

## 2015-04-13 LAB — CBC
HCT: 30.6 % — ABNORMAL LOW (ref 36.0–46.0)
Hemoglobin: 10.2 g/dL — ABNORMAL LOW (ref 12.0–15.0)
MCH: 30.4 pg (ref 26.0–34.0)
MCHC: 33.3 g/dL (ref 30.0–36.0)
MCV: 91.1 fL (ref 78.0–100.0)
PLATELETS: 142 10*3/uL — AB (ref 150–400)
RBC: 3.36 MIL/uL — AB (ref 3.87–5.11)
RDW: 14.2 % (ref 11.5–15.5)
WBC: 6.1 10*3/uL (ref 4.0–10.5)

## 2015-04-13 LAB — HEPARIN LEVEL (UNFRACTIONATED)
Heparin Unfractionated: 0.98 IU/mL — ABNORMAL HIGH (ref 0.30–0.70)
Heparin Unfractionated: 0.72 IU/mL — ABNORMAL HIGH (ref 0.30–0.70)

## 2015-04-13 LAB — HEMOGLOBIN A1C
Hgb A1c MFr Bld: 5.6 % (ref 4.8–5.6)
MEAN PLASMA GLUCOSE: 114 mg/dL

## 2015-04-13 LAB — TROPONIN I
TROPONIN I: 0.07 ng/mL — AB (ref <0.031)
TROPONIN I: 0.07 ng/mL — AB (ref <0.031)

## 2015-04-13 MED ORDER — RIVAROXABAN 15 MG PO TABS
15.0000 mg | ORAL_TABLET | Freq: Two times a day (BID) | ORAL | Status: DC
  Administered 2015-04-13 – 2015-04-14 (×2): 15 mg via ORAL
  Filled 2015-04-13 (×2): qty 1

## 2015-04-13 MED ORDER — INSULIN ASPART 100 UNIT/ML ~~LOC~~ SOLN: 0.0000 [IU] | SUBCUTANEOUS | Status: DC

## 2015-04-13 NOTE — Progress Notes (Signed)
  Echocardiogram 2D Echocardiogram has been performed.  Leta JunglingCooper, Stephen Turnbaugh M 04/13/2015, 10:55 AM

## 2015-04-13 NOTE — Clinical Documentation Improvement (Signed)
Hospitalist  Please update your documentation within the medical record to reflect your response to this query. Thank you  Can the noted clinical findings be further specified?   Identify Type - morbid obesity, obesity (link the BMI to condition e.g. morbid obesity with BMI of 47), overweight, with alveolar hypoventilation (Pickwickian Syndrome)  Other  Clinically Undetermined  Document any associated diagnoses/conditions.  Supporting Information: 04/12/15 ED MD note: "Ht 5\' 3"  (1.6 m)  Wt 240 lb (108.863 kg)  BMI 42.52 kg/m2"   Please exercise your independent, professional judgment when responding. A specific answer is not anticipated or expected.  Thank You,  Toribio Harbourphelia R Quanetta Truss, RN, BSN, CCDS Certified Clinical Documentation Specialist [place]: Health Information Management [phone]

## 2015-04-13 NOTE — Progress Notes (Signed)
ANTICOAGULATION CONSULT NOTE - Initial Consult  Pharmacy Consult for Xarelto Indication: pulmonary embolus  No Known Allergies Patient Measurements: Height: 5\' 3"  (160 cm) Weight: 242 lb 1 oz (109.8 kg) IBW/kg (Calculated) : 52.4 Vital Signs: Temp: 98.3 F (36.8 C) (12/06 1639) Temp Source: Oral (12/06 1639) BP: 107/59 mmHg (12/06 1639) Pulse Rate: 70 (12/06 1639) Labs:  Recent Labs  04/12/15 0230  04/12/15 1415 04/12/15 1826 04/13/15 0058 04/13/15 0600 04/13/15 0920  HGB 12.4  --   --   --   --  10.2*  --   HCT 36.1  --   --   --   --  30.6*  --   PLT 141*  --   --   --   --  142*  --   HEPARINUNFRC  --   --  1.16*  --  0.98*  --  0.72*  CREATININE 1.36*  --   --   --   --   --   --   TROPONINI  --   < >  --  0.07* 0.07* 0.07*  --   < > = values in this interval not displayed. Estimated Creatinine Clearance: 45.8 mL/min (by C-G formula based on Cr of 1.36).  Medical History: Past Medical History  Diagnosis Date  . Hypertension   . Arthritis of knee    Assessment: 69 year old female on IV heparin for PE to transition to Xarelto.  SCr from 12/5 is 1.36 on admission- no further SCr. Estimated CrCl ~45-50 mL/min. Decent UOP recorded.   Goal of Therapy:  Monitor platelets by anticoagulation protocol: Yes   Plan:  Xarelto 15mg  po BID with meals- 1st dose tonight- for 21 days, then 20mg  po daily with supper.   Stop heparin when give Xarelto.  D/C heparin levels.  Follow-up SCr in AM.   Link SnufferJessica Susanne Baumgarner, PharmD, BCPS Clinical Pharmacist [phone] 04/13/2015,6:35 PM

## 2015-04-13 NOTE — Progress Notes (Signed)
ANTICOAGULATION CONSULT NOTE - Follow Up Consult  Pharmacy Consult for heparin Indication: pulmonary embolus  Labs:  Recent Labs  04/12/15 0230 04/12/15 0928 04/12/15 1415 04/12/15 1826 04/13/15 0058  HGB 12.4  --   --   --   --   HCT 36.1  --   --   --   --   PLT 141*  --   --   --   --   HEPARINUNFRC  --   --  1.16*  --  0.98*  CREATININE 1.36*  --   --   --   --   TROPONINI  --  0.09*  --  0.07*  --      Assessment: 69yo female remains above goal on heparin for PE.  Goal of Therapy:  Heparin level 0.3-0.7 units/ml   Plan:  Will decrease heparin gtt by 2 units/kg/hr to 800 units/hr and check level in 8hr; plan to change to DOAC later today.  Cassandra [name], PharmD, BCPS  04/13/2015,1:34 AM

## 2015-04-13 NOTE — Progress Notes (Signed)
ANTICOAGULATION CONSULT NOTE - Follow Up Consult  Pharmacy Consult for heparin Indication: pulmonary embolus  No Known Allergies  Patient Measurements: Height: 5\' 3"  (160 cm) Weight: 242 lb 1 oz (109.8 kg) IBW/kg (Calculated) : 52.4 Heparin Dosing Weight: 80kg  Vital Signs: Temp: 97.9 F (36.6 C) (12/06 0730) Temp Source: Oral (12/06 0730) BP: 154/86 mmHg (12/06 1100) Pulse Rate: 72 (12/06 0800)  Labs:  Recent Labs  04/12/15 0230  04/12/15 1415 04/12/15 1826 04/13/15 0058 04/13/15 0600 04/13/15 0920  HGB 12.4  --   --   --   --  10.2*  --   HCT 36.1  --   --   --   --  30.6*  --   PLT 141*  --   --   --   --  142*  --   HEPARINUNFRC  --   --  1.16*  --  0.98*  --  0.72*  CREATININE 1.36*  --   --   --   --   --   --   TROPONINI  --   < >  --  0.07* 0.07* 0.07*  --   < > = values in this interval not displayed.  Estimated Creatinine Clearance: 45.8 mL/min (by C-G formula based on Cr of 1.36).  Assessment: 69 year old female found to have multiple PEs started on IV heparin. IV heparin continues to be above goal despite rate adjustments. No bleeding issues noted, hgb down from yesterday 12.4>>10.2.  Goal of Therapy:  Heparin level 0.3-0.7 units/ml Monitor platelets by anticoagulation protocol: Yes   Plan:  Decrease heparin to 700 units/hr Recheck HL in 8 hours if patient does not transition to po  Sheppard CoilFrank Aryn Safran PharmD., BCPS Clinical Pharmacist Pager [phone] 04/13/2015 11:39 AM

## 2015-04-13 NOTE — Progress Notes (Signed)
[place] TEAM 1 - Stepdown/ICU TEAM Progress Note  Dolphus JennyHelen H Simien ZOX:[id number] DOB: 1946/04/11 DOA: 04/12/2015 PCP: Pcp Not In System  Admit HPI / Brief Narrative: 69 y.o.BF PMHx  HTN Leg pain. L leg. Started around 23:00 on 04/11/15. Acute onset when ambulating from chair to bed. L hip radiates down to foot. Associated w/ numbness in the foot but denies back pain. Ibuprofen w/o relief. Worse w/ ambulation. Constant. No loss of bowel or bladder function or saddle anesthesia. No recent changes to medications. Associated, SOB x3 days but no CP, cough, fevers, palpitations. Recent   Echo and CXR from PCP a couple days ago were nml.   HPI/Subjective: 12/6 A/O 4, NAD, negative SOB, negative lower extremity pain. States no family history of hypercoagulability, negative recent injuries, negative long trips, negative previous PE/DVT  Assessment/Plan: Pulmonary embolism: - CT scan showing large bilateral central and lobar PE, with right cardiac strain. D-dimer 14.52. PESI class I.  -Heparin started in the ED. discussed pros and cons of Coumadin  Vs NOAC. Patient has chosen Xarelto  -Start Xarelto  - Initiate hypercoagulation workup once off anticoagulation in 3-6 mo - protein C, Protein S, antithrombin, Factor V, prothrombin - O2 PRN comfort - Bilat LE dopplers pending - Echocardiogram; C/W right heart strain -Ambulatory SPO2 -PT/OT evaluate  AKI vs CKD: Cr 1.36. Previous 1.0 in 2012.  - Normal saline 75 ml/hr  - Discontinue ACEI/ARB -Hold all nephrotoxic medication  Hyperglycemia:  -185 on admission. No h/o DM. - A1c pending - Sensitive SSI   Hypokalemia:  -Potassium 40 mEq BID Monitor potassium - Monitor Mag  HTN: -See AKI -If BP medication needed would start on beta blocker, CCB or hydralazine  Dependent edema: -Resolved   Code Status: FULL Family Communication: no family present at time of exam Disposition Plan: SNF?    Consultants: NA  Procedure/Significant  Events: 12/5 CT angiogram chest PE protocol;Large bilateral central and lobar pulmonary artery emboli with findings concerning for a degree of right cardiac straining. 12/6 echocardiogram;- LVEF 55%- 60%. - Right ventricle: moderately dilated.  - Right atrium: mildly dilated.- Pulmonary arteries: PA peak pressure: 39 mm Hg (S).  Culture NA  Antibiotics:   DVT prophylaxis: Heparin--> Xarelto   Devices NA   LINES / TUBES:  NA    Continuous Infusions: . sodium chloride 75 mL/hr (04/13/15 1411)  . heparin 700 Units/hr (04/13/15 1136)    Objective: VITAL SIGNS: Temp: 98.3 F (36.8 C) (12/06 1639) Temp Source: Oral (12/06 1639) BP: 107/59 mmHg (12/06 1639) Pulse Rate: 70 (12/06 1639) SPO2; FIO2:   Intake/Output Summary (Last 24 hours) at 04/13/15 1830 Last data filed at 04/13/15 1800  Gross per 24 hour  Intake 2400.7 ml  Output   1300 ml  Net 1100.7 ml     Exam: General: A/O 4, NAD, No acute respiratory distress Eyes: Negative headache, eye pain, double vision,negative scleral hemorrhage ENT: Negative Runny nose, negative ear pain, negative gingival bleeding, Neck:  Negative scars, masses, torticollis, lymphadenopathy, JVD Lungs: Clear to auscultation bilaterally without wheezes or crackles Cardiovascular: Regular rate and rhythm without murmur gallop or rub normal S1 and S2 Abdomen:negative abdominal pain, nondistended, positive soft, bowel sounds, no rebound, no ascites, no appreciable mass Extremities: No significant cyanosis, clubbing, or edema bilateral lower extremities Psychiatric:  Negative depression, negative anxiety, negative fatigue, negative mania  Neurologic:  Cranial nerves II through XII intact, tongue/uvula midline, all extremities muscle strength 5/5, sensation intact throughout,  negative dysarthria, negative expressive aphasia,  negative receptive aphasia.   Data Reviewed: Basic Metabolic Panel:  Recent Labs Lab 04/12/15 0230  04/12/15 0928  NA 133*  --   K 3.2*  --   CL 101  --   CO2 20*  --   GLUCOSE 185*  --   BUN 14  --   CREATININE 1.36*  --   CALCIUM 8.8*  --   MG  --  1.5*  PHOS  --  3.3   Liver Function Tests: No results for input(s): AST, ALT, ALKPHOS, BILITOT, PROT, ALBUMIN in the last 168 hours. No results for input(s): LIPASE, AMYLASE in the last 168 hours. No results for input(s): AMMONIA in the last 168 hours. CBC:  Recent Labs Lab 04/12/15 0230 04/13/15 0600  WBC 9.9 6.1  NEUTROABS 7.6  --   HGB 12.4 10.2*  HCT 36.1 30.6*  MCV 89.6 91.1  PLT 141* 142*   Cardiac Enzymes:  Recent Labs Lab 04/12/15 0928 04/12/15 1826 04/13/15 0058 04/13/15 0600  TROPONINI 0.09* 0.07* 0.07* 0.07*   BNP (last 3 results) No results for input(s): BNP in the last 8760 hours.  ProBNP (last 3 results) No results for input(s): PROBNP in the last 8760 hours.  CBG:  Recent Labs Lab 04/12/15 1214 04/12/15 1644 04/12/15 2334  GLUCAP 95 137* 103*    Recent Results (from the past 240 hour(s))  MRSA PCR Screening     Status: None   Collection Time: 04/12/15  6:39 AM  Result Value Ref Range Status   MRSA by PCR NEGATIVE NEGATIVE Final    Comment:        The GeneXpert MRSA Assay (FDA approved for NASAL specimens only), is one component of a comprehensive MRSA colonization surveillance program. It is not intended to diagnose MRSA infection nor to guide or monitor treatment for MRSA infections.      Studies:  Recent x-ray studies have been reviewed in detail by the Attending Physician  Scheduled Meds:  Scheduled Meds: . antiseptic oral rinse  7 mL Mouth Rinse BID  . insulin aspart  0-9 Units Subcutaneous 6 times per day  . potassium chloride  40 mEq Oral BID  . sodium chloride  3 mL Intravenous Q12H    Time spent on care of this patient: 40 mins   Sakari Raisanen, Roselind Messier , MD  [facility] Office  [phone] Pager - [phone]  On-Call/Text Page:      [email or web address]       password [email or web address]  If 7PM-7AM, please contact night-coverage [email or web address] Password [email or web address] 04/13/2015, 6:30 PM   LOS: 1 day   Care during the described time interval was provided by me .  I have reviewed this patient's available data, including medical history, events of note, physical examination, and all test results as part of my evaluation. I have personally reviewed and interpreted all radiology studies.   Carolyne Littles, MD [phone] Pager

## 2015-04-14 ENCOUNTER — Inpatient Hospital Stay (HOSPITAL_COMMUNITY): Payer: Medicare Other

## 2015-04-14 ENCOUNTER — Encounter (HOSPITAL_COMMUNITY): Payer: Self-pay | Admitting: Radiology

## 2015-04-14 DIAGNOSIS — I2609 Other pulmonary embolism with acute cor pulmonale: Secondary | ICD-10-CM

## 2015-04-14 DIAGNOSIS — I2699 Other pulmonary embolism without acute cor pulmonale: Secondary | ICD-10-CM

## 2015-04-14 DIAGNOSIS — I998 Other disorder of circulatory system: Secondary | ICD-10-CM

## 2015-04-14 DIAGNOSIS — R52 Pain, unspecified: Secondary | ICD-10-CM

## 2015-04-14 DIAGNOSIS — I771 Stricture of artery: Secondary | ICD-10-CM

## 2015-04-14 DIAGNOSIS — I10 Essential (primary) hypertension: Secondary | ICD-10-CM

## 2015-04-14 DIAGNOSIS — I50811 Acute right heart failure: Secondary | ICD-10-CM

## 2015-04-14 DIAGNOSIS — I749 Embolism and thrombosis of unspecified artery: Secondary | ICD-10-CM

## 2015-04-14 LAB — COMPREHENSIVE METABOLIC PANEL
ALT: 25 U/L (ref 14–54)
AST: 43 U/L — AB (ref 15–41)
Albumin: 3 g/dL — ABNORMAL LOW (ref 3.5–5.0)
Alkaline Phosphatase: 67 U/L (ref 38–126)
Anion gap: 6 (ref 5–15)
BILIRUBIN TOTAL: 0.4 mg/dL (ref 0.3–1.2)
BUN: 8 mg/dL (ref 6–20)
CO2: 23 mmol/L (ref 22–32)
CREATININE: 1.13 mg/dL — AB (ref 0.44–1.00)
Calcium: 8.8 mg/dL — ABNORMAL LOW (ref 8.9–10.3)
Chloride: 110 mmol/L (ref 101–111)
GFR calc Af Amer: 56 mL/min — ABNORMAL LOW (ref >60)
GFR, EST NON AFRICAN AMERICAN: 48 mL/min — AB (ref >60)
Glucose, Bld: 105 mg/dL — ABNORMAL HIGH (ref 65–99)
Potassium: 4.6 mmol/L (ref 3.5–5.1)
Sodium: 139 mmol/L (ref 135–145)
TOTAL PROTEIN: 6.6 g/dL (ref 6.5–8.1)

## 2015-04-14 LAB — CBC
HEMATOCRIT: 33.2 % — AB (ref 36.0–46.0)
Hemoglobin: 10.8 g/dL — ABNORMAL LOW (ref 12.0–15.0)
MCH: 29.9 pg (ref 26.0–34.0)
MCHC: 32.5 g/dL (ref 30.0–36.0)
MCV: 92 fL (ref 78.0–100.0)
Platelets: 187 10*3/uL (ref 150–400)
RBC: 3.61 MIL/uL — AB (ref 3.87–5.11)
RDW: 14.2 % (ref 11.5–15.5)
WBC: 7.5 10*3/uL (ref 4.0–10.5)

## 2015-04-14 LAB — MAGNESIUM: MAGNESIUM: 1.7 mg/dL (ref 1.7–2.4)

## 2015-04-14 MED ORDER — HEPARIN (PORCINE) IN NACL 100-0.45 UNIT/ML-% IJ SOLN
1300.0000 [IU]/h | INTRAMUSCULAR | Status: DC
  Administered 2015-04-14: 700 [IU]/h via INTRAVENOUS
  Administered 2015-04-16: 1300 [IU]/h via INTRAVENOUS
  Filled 2015-04-14 (×2): qty 250

## 2015-04-14 MED ORDER — IOHEXOL 350 MG/ML SOLN
100.0000 mL | Freq: Once | INTRAVENOUS | Status: AC | PRN
  Administered 2015-04-14: 100 mL via INTRAVENOUS

## 2015-04-14 NOTE — Progress Notes (Signed)
*  PRELIMINARY RESULTS* Vascular Ultrasound Bilateral lower extremity venous duplex completed. The right lower extremity is negative for deep vein thrombosis. The left lower extremity is positive for deep vein thrombosis involving the left popliteal, posterior tibial, and peroneal veins. There is no evidence of Baker's cyst bilaterally.   ABI completed:    RIGHT    LEFT    PRESSURE WAVEFORM  PRESSURE WAVEFORM  BRACHIAL 169 Triphasic BRACHIAL 175 Triphasic  DP 119 Triphasic DP  Unable to insonate  AT   AT    PT 187 Triphasic PT  Unable to insonate  PER   PER    GREAT TOE 43 NA GREAT TOE Unable to obtain NA    RIGHT LEFT  ABI 1.07 Unable to calculate  TBI 0.25 Unable to calculate   The right ABI is within normal limits. The right TBI is abnormal. Unable to insonate any left lower extremity arteries, therefore unable to calculate ABI or TBI.   Lower Extremity Arterial Duplex has been completed.   The right common femoral artery exhibits triphasic waveforms. 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 There is no obvious evidence of hemodynamically significant stenosis of the left lower extremity. 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  Preliminary results discussed with Dr. Sharon SellerMcClung.  04/14/2015 11:00 AM Cassandra FeyMichelle Emi [name], RVT, RDCS, RDMS

## 2015-04-14 NOTE — Consult Note (Signed)
 Cardiologist:  New Reason for Consult:  Preop consult Referring Physician:  Cassandra [name] is an 69 y.o. female.  HPI:   Patient is a 69-year-old female with history of hypertension, arthritis, cholecystectomy and breast surgery.  Patient was admitted on December 5 with left lower extremity pain and shortness of breath. She been diagnosed with a pulmonary embolism left popliteal DVT she also appears to have had an embolic event to the left common iliac artery and left popliteal artery. She was previously started on Xarelto which has been discontinued and we'll resume IV heparin.  Dr. Lawson plans exploration of the left popliteal artery and possibly left femoral artery on Friday morning.  Check echocardiogram yesterday which revealed an ejection fraction of 55-60% with normal wall motion. Right ventricle cavity size was moderately dilated wall thickness was normal. Right atrium was mildly dilated peak PA pressure 39 mmHg.  We were asked to consult for preoperative clearance.  The patient reports developing dyspnea last Thursday.  She was seen by her PCP and CXR and other WU was ok.  She became more short of breath on Sunday and developed left LE pain and tingling.  Up until about two months ago she was excercising about 3x per week at the gym, lifting weights and walking on a treadmill.  She decreased the amount because of arthritis in her knee. Currently she feels fatigued after all the tests today.  No chest pain.  Her mom live to be 99 and her father 83.     Past Medical History  Diagnosis Date  . Hypertension   . Arthritis of knee     Past Surgical History  Procedure Laterality Date  . Cholecystectomy    . Hemorrhoid surgery    . Breast surgery      lumpectomy on Right breast    Family History  Problem Relation Age of Onset  . Hypertension Father   . Diabetes Brother     Social History:  reports that she has never smoked. She does not have any smokeless tobacco history on file.  She reports that she does not drink alcohol or use illicit drugs.  Allergies: No Known Allergies  Medications:  Scheduled Meds: . antiseptic oral rinse  7 mL Mouth Rinse BID  . sodium chloride  3 mL Intravenous Q12H   Continuous Infusions: . sodium chloride 100 mL/hr at 04/14/15 1203  . heparin     PRN Meds:.acetaminophen **OR** acetaminophen, ondansetron **OR** ondansetron (ZOFRAN) IV, oxyCODONE   Results for orders placed or performed during the hospital encounter of 04/12/15 (from the past 48 hour(s))  Glucose, capillary     Status: Abnormal   Collection Time: 04/12/15  4:44 PM  Result Value Ref Range   Glucose-Capillary 137 (H) 65 - 99 mg/dL   Comment 1 Document in Chart   Troponin I (q 6hr x 3)     Status: Abnormal   Collection Time: 04/12/15  6:26 PM  Result Value Ref Range   Troponin I 0.07 (H) <0.031 ng/mL    Comment:        PERSISTENTLY INCREASED TROPONIN VALUES IN THE RANGE OF 0.04-0.49 ng/mL CAN BE SEEN IN:       -UNSTABLE ANGINA       -CONGESTIVE HEART FAILURE       -MYOCARDITIS       -CHEST TRAUMA       -ARRYHTHMIAS       -LATE PRESENTING MYOCARDIAL INFARCTION       -COPD     CLINICAL FOLLOW-UP RECOMMENDED.   Glucose, capillary     Status: Abnormal   Collection Time: 04/12/15 11:34 PM  Result Value Ref Range   Glucose-Capillary 103 (H) 65 - 99 mg/dL   Comment 1 Capillary Specimen   Troponin I (q 6hr x 3)     Status: Abnormal   Collection Time: 04/13/15 12:58 AM  Result Value Ref Range   Troponin I 0.07 (H) <0.031 ng/mL    Comment:        PERSISTENTLY INCREASED TROPONIN VALUES IN THE RANGE OF 0.04-0.49 ng/mL CAN BE SEEN IN:       -UNSTABLE ANGINA       -CONGESTIVE HEART FAILURE       -MYOCARDITIS       -CHEST TRAUMA       -ARRYHTHMIAS       -LATE PRESENTING MYOCARDIAL INFARCTION       -COPD   CLINICAL FOLLOW-UP RECOMMENDED.   Heparin level (unfractionated)     Status: Abnormal   Collection Time: 04/13/15 12:58 AM  Result Value Ref Range    Heparin Unfractionated 0.98 (H) 0.30 - 0.70 IU/mL    Comment:        IF HEPARIN RESULTS ARE BELOW EXPECTED VALUES, AND PATIENT DOSAGE HAS BEEN CONFIRMED, SUGGEST FOLLOW UP TESTING OF ANTITHROMBIN III LEVELS.   CBC     Status: Abnormal   Collection Time: 04/13/15  6:00 AM  Result Value Ref Range   WBC 6.1 4.0 - 10.5 K/uL   RBC 3.36 (L) 3.87 - 5.11 MIL/uL   Hemoglobin 10.2 (L) 12.0 - 15.0 g/dL   HCT 30.6 (L) 36.0 - 46.0 %   MCV 91.1 78.0 - 100.0 fL   MCH 30.4 26.0 - 34.0 pg   MCHC 33.3 30.0 - 36.0 g/dL   RDW 14.2 11.5 - 15.5 %   Platelets 142 (L) 150 - 400 K/uL  Troponin I (q 6hr x 3)     Status: Abnormal   Collection Time: 04/13/15  6:00 AM  Result Value Ref Range   Troponin I 0.07 (H) <0.031 ng/mL    Comment:        PERSISTENTLY INCREASED TROPONIN VALUES IN THE RANGE OF 0.04-0.49 ng/mL CAN BE SEEN IN:       -UNSTABLE ANGINA       -CONGESTIVE HEART FAILURE       -MYOCARDITIS       -CHEST TRAUMA       -ARRYHTHMIAS       -LATE PRESENTING MYOCARDIAL INFARCTION       -COPD   CLINICAL FOLLOW-UP RECOMMENDED.   Heparin level (unfractionated)     Status: Abnormal   Collection Time: 04/13/15  9:20 AM  Result Value Ref Range   Heparin Unfractionated 0.72 (H) 0.30 - 0.70 IU/mL    Comment:        IF HEPARIN RESULTS ARE BELOW EXPECTED VALUES, AND PATIENT DOSAGE HAS BEEN CONFIRMED, SUGGEST FOLLOW UP TESTING OF ANTITHROMBIN III LEVELS.   CBC     Status: Abnormal   Collection Time: 04/14/15  4:47 AM  Result Value Ref Range   WBC 7.5 4.0 - 10.5 K/uL   RBC 3.61 (L) 3.87 - 5.11 MIL/uL   Hemoglobin 10.8 (L) 12.0 - 15.0 g/dL   HCT 33.2 (L) 36.0 - 46.0 %   MCV 92.0 78.0 - 100.0 fL   MCH 29.9 26.0 - 34.0 pg   MCHC 32.5 30.0 - 36.0 g/dL   RDW 14.2 11.5 - 15.5 %  Platelets 187 150 - 400 K/uL  Comprehensive metabolic panel     Status: Abnormal   Collection Time: 04/14/15  4:47 AM  Result Value Ref Range   Sodium 139 135 - 145 mmol/L   Potassium 4.6 3.5 - 5.1 mmol/L   Chloride  110 101 - 111 mmol/L   CO2 23 22 - 32 mmol/L   Glucose, Bld 105 (H) 65 - 99 mg/dL   BUN 8 6 - 20 mg/dL   Creatinine, Ser 1.13 (H) 0.44 - 1.00 mg/dL   Calcium 8.8 (L) 8.9 - 10.3 mg/dL   Total Protein 6.6 6.5 - 8.1 g/dL   Albumin 3.0 (L) 3.5 - 5.0 g/dL   AST 43 (H) 15 - 41 U/L   ALT 25 14 - 54 U/L   Alkaline Phosphatase 67 38 - 126 U/L   Total Bilirubin 0.4 0.3 - 1.2 mg/dL   GFR calc non Af Amer 48 (L) >60 mL/min   GFR calc Af Amer 56 (L) >60 mL/min    Comment: (NOTE) The eGFR has been calculated using the CKD EPI equation. This calculation has not been validated in all clinical situations. eGFR's persistently <60 mL/min signify possible Chronic Kidney Disease.    Anion gap 6 5 - 15  Magnesium     Status: None   Collection Time: 04/14/15  4:47 AM  Result Value Ref Range   Magnesium 1.7 1.7 - 2.4 mg/dL    No results found.  Review of Systems  Constitutional: Positive for malaise/fatigue.  HENT: Negative for congestion and sore throat.   Respiratory: Positive for shortness of breath. Negative for cough.   Cardiovascular: Positive for leg swelling. Negative for chest pain, palpitations and orthopnea.  Gastrointestinal: Negative for nausea, vomiting, abdominal pain, blood in stool and melena.  Genitourinary: Negative for hematuria.  Musculoskeletal: Positive for myalgias (left foot pain).  Neurological: Positive for tingling (left foot). Negative for dizziness.  All other systems reviewed and are negative.  Blood pressure 134/72, pulse 70, temperature 98.5 F (36.9 C), temperature source Oral, resp. rate 10, height 5' 3" (1.6 m), weight 242 lb 1 oz (109.8 kg), SpO2 97 %. Physical Exam  Nursing note and vitals reviewed. Constitutional: She is oriented to person, place, and time. She appears well-developed.  Obese   HENT:  Head: Normocephalic and atraumatic.  Eyes: EOM are normal. Pupils are equal, round, and reactive to light. No scleral icterus.  Neck: Normal range of  motion. Neck supple.  Cardiovascular: Normal rate, regular rhythm, S1 normal and S2 normal.   No murmur heard. Pulses:      Radial pulses are 2+ on the right side, and 2+ on the left side.       Posterior tibial pulses are 2+ on the right side, and 1+ on the left side.  No carotid bruits  Respiratory: Effort normal and breath sounds normal. No respiratory distress. She has no wheezes. She has no rales.  GI: Soft. Bowel sounds are normal. She exhibits no distension. There is no tenderness.  Musculoskeletal:  Trace LEE  Neurological: She is alert and oriented to person, place, and time.  Skin: Skin is warm and dry.  Psychiatric: She has a normal mood and affect.    Assessment/Plan: Active Problems:   Pulmonary embolism (HCC)   Hyperglycemia   AKI (acute kidney injury) ([place])   Hypokalemia   Essential hypertension   Dependent edema   PE (pulmonary embolism)   Preop clearance  69 year old female with history of hypertension,  arthritis, cholecystectomy and breast surgery.  She's never used tobacco and does not drink ETOH.   She was on statin but stopped it on her own.  Her mom lived to be 10 and her dad 72.  Up until 2 months ago she was exercising 3x per week at the gym and only reduced the frequency due to knee arthritis.  She was diagnosed with large bilateral central and lobar pulmonary artery emboli and also left common, internal, and external iliac lower extremity thromboembolism.  Echocardiogram revealed normal LV function(55-60%), mild RA dilation, moderate RV dilation and peak PA pressure 64mHg. No identified septal defects.  Troponin trend flat at 0.07.  No signs of ischemic heart disease.  I do not think further testing is required.   Dr. STamala Julianto see.   HTarri Fuller [facility]12/11/2014, 4:39 PM

## 2015-04-14 NOTE — Progress Notes (Signed)
ANTICOAGULATION CONSULT NOTE - Initial Consult  Pharmacy Consult:  Xarelto >> Heparin Indication: pulmonary embolus  No Known Allergies  Patient Measurements: Height: 5\' 3"  (160 cm) Weight: 242 lb 1 oz (109.8 kg) IBW/kg (Calculated) : 52.4 Heparin Dosing Weight: 79 kg  Vital Signs: Temp: 98.5 F (36.9 C) (12/07 1509) Temp Source: Oral (12/07 1509) BP: 134/72 mmHg (12/07 1258) Pulse Rate: 70 (12/07 1258)  Labs:  Recent Labs  04/12/15 0230  04/12/15 1415 04/12/15 1826 04/13/15 0058 04/13/15 0600 04/13/15 0920 04/14/15 0447  HGB 12.4  --   --   --   --  10.2*  --  10.8*  HCT 36.1  --   --   --   --  30.6*  --  33.2*  PLT 141*  --   --   --   --  142*  --  187  HEPARINUNFRC  --   --  1.16*  --  0.98*  --  0.72*  --   CREATININE 1.36*  --   --   --   --   --   --  1.13*  TROPONINI  --   < >  --  0.07* 0.07* 0.07*  --   --   < > = values in this interval not displayed.  Estimated Creatinine Clearance: 55.1 mL/min (by C-G formula based on Cr of 1.13).   Medical History: Past Medical History  Diagnosis Date  . Hypertension   . Arthritis of knee        Assessment: [age] YOF [facility] with leg pain and SOB, found to have bilateral PE with right heart strain and was started on IV heparin.  [facility] and was started on Xarelto on 04/13/15, which she received 2 doses (last dose 04/14/15 at 0730).  Now to transition back to IV heparin for further procedures.  Doppler also confirmed LLE DVT.  Labs reviewed.  Will be using aPTT to assess heparin therapy as Xarelto may falsely elevated heparin levels.   Goal of Therapy:  Heparin level 0.3-0.7 units/ml  APTT 66-102 s Monitor platelets by anticoagulation protocol: Yes    Plan:  - At 1930, resume heparin gtt at 700 units/hr - Check 8 hr aPTT / HL / CBC - Daily HL / CBC / aPTT    Jahlen Bollman D. Laney Potashang, PharmD, BCPS Pager:  [phone] 04/14/2015, 3:42 PM

## 2015-04-14 NOTE — Progress Notes (Signed)
PT Cancellation Note  Patient Details Name: Cassandra [name] MRN: [id number] DOB: 08/11/1945   Cancelled Treatment:    Reason Eval/Treat Not Completed: Medical issues which prohibited therapy.  Per RN lack of pulse Lt foot as of this am, pt is not currently appropriate for PT.  Will continue to follow.  Ashley Parr PT, DPT [phone] Pager: [phone] 04/14/2015, 1:11 PM   

## 2015-04-14 NOTE — Progress Notes (Signed)
OT Cancellation Note  Patient Details Name: Cassandra [name] MRN: [id number] DOB: [birth year]   Cancelled Treatment:    Reason Eval/Treat Not Completed: Patient at procedure or test/ unavailable (Pt in vascular lab.  Will continue to follow.)  Evern BioMayberry, Rumi Kolodziej Lynn 04/14/2015, 10:31 AM

## 2015-04-14 NOTE — Progress Notes (Signed)
Patient ID: Cassandra [name], female   DOB: 1945/05/13, 69 y.o.   MRN: [id number] Patient had CT angiogram of abdomen and pelvis with bilateral runoff which I have reviewed and discussed with radiologist Patient has what appears to be embolic event to left common iliac artery and left popliteal artery which occurred 3 days ago. Still unclear about the etiology of the embolus  This could represent paradoxical embolus from right to left intracardiac shunt-ASD versus PFO  We will get cardiology consult and discontinue Gibson RampXeralto which she had this morning and resume IV heparin drip  Plan exploration left popliteal artery and possibly left femoral artery on Friday morning Discussed with patient she understands and excepts agrees to proceed Currently patient has sensation in the left foot with good motion and good dorsiflexion with no calf tenderness  If her symptoms worsened we will proceed sooner

## 2015-04-14 NOTE — Consult Note (Signed)
[facility] CONSULT [place]   MRN : [id number]  Reason for Consult: left LE PAD-ischemic left leg Referring Physician: Sharon SellerMcClung  History of Present Illness: 69 y/o female admitted 04/12/2015 for left LE pain and SOB.  She states the left leg pain started Sunday 04/11/2015 in the left hip and traveled down to her foot.  She has numbness and pain with ambulation.  No rest pain is reported.  No history of ulcers.  Since her admission she has been diagnosed with a PE and left popliteal DVT.  She has received Xarelto times 2 doses.  She reports no history of A fib.  Past medical history includes: hypertension managed with Lotensin.  She denise hypercholesterolemia and DM.     Current Facility-Administered Medications  Medication Dose Route Frequency Provider Last Rate Last Dose  . 0.9 %  sodium chloride infusion   Intravenous Continuous Lonia BloodJeffrey T McClung, MD 75 mL/hr at 04/13/15 2033    . acetaminophen (TYLENOL) tablet 650 mg  650 mg Oral Q6H PRN Ozella Rocksavid J Merrell, MD       Or  . acetaminophen (TYLENOL) suppository 650 mg  650 mg Rectal Q6H PRN Ozella Rocksavid J Merrell, MD      . antiseptic oral rinse (CPC / CETYLPYRIDINIUM CHLORIDE 0.05%) solution 7 mL  7 mL Mouth Rinse BID Eduard ClosArshad N Kakrakandy, MD   7 mL at 04/14/15 1000  . ondansetron (ZOFRAN) tablet 4 mg  4 mg Oral Q6H PRN Ozella Rocksavid J Merrell, MD       Or  . ondansetron [facility](ZOFRAN) injection 4 mg  4 mg Intravenous Q6H PRN Ozella Rocksavid J Merrell, MD      . oxyCODONE (Oxy IR/ROXICODONE) immediate release tablet 5 mg  5 mg Oral Q4H PRN Ozella Rocksavid J Merrell, MD   5 mg at 04/14/15 0531  . Rivaroxaban (XARELTO) tablet 15 mg  15 mg Oral BID WC Quenton FetterJessica B Millen, RPH   15 mg at 04/14/15 0731  . sodium chloride 0.9 % injection 3 mL  3 mL Intravenous Q12H Ozella Rocksavid J Merrell, MD   3 mL at 04/13/15 2200    Pt meds include: Statin :No Betablocker: No ASA: No Other anticoagulants/antiplatelets: since hospitalization she has had 2 doses of Xarelto  Past  Medical History  Diagnosis Date  . Hypertension   . Arthritis of knee     Past Surgical History  Procedure Laterality Date  . Cholecystectomy    . Hemorrhoid surgery    . Breast surgery      lumpectomy on Right breast    Social History Social History  Substance Use Topics  . Smoking status: Never Smoker   . Smokeless tobacco: None  . Alcohol Use: No    Family History Family History  Problem Relation Age of Onset  . Hypertension Father   . Diabetes Brother     No Known Allergies   REVIEW OF SYSTEMS  General: [ ]  Weight loss, [ ]  Fever, [ ]  chills Neurologic: [ ]  Dizziness, [ ]  Blackouts, [ ]  Seizure [ ]  Stroke, [ ]  "Mini stroke", [ ]  Slurred speech, [ ]  Temporary blindness; [ ]  weakness in arms or legs, [ ]  Hoarseness [ ]  Dysphagia Cardiac: [ ]  Chest pain/pressure, [x ] Shortness of breath at rest [ ]  Shortness of breath with exertion, [ ]  Atrial fibrillation or irregular heartbeat  Vascular: [x ] Pain in legs with walking, [ ]  Pain in legs at rest, [ ]  Pain in legs at night,  [ ]   Non-healing ulcer,  Blood clot in vein/DVT,   Pulmonary:  Home oxygen,  Productive cough,  Coughing up blood,  Asthma,   Wheezing  COPD Musculoskeletal:   Arthritis,  Low back pain,  Joint pain Hematologic:  Easy Bruising,  Anemia;  Hepatitis Gastrointestinal:  Blood in stool,  Gastroesophageal Reflux/heartburn, Urinary:  chronic Kidney disease,  on HD -  MWF or  TTHS,  Burning with urination,  Difficulty urinating Skin:  Rashes,  Wounds Psychological:  Anxiety,  Depression  Physical Examination Filed Vitals:   04/13/15 2336 04/14/15 0446 04/14/15 0449 04/14/15 0756  BP: 140/72  147/79   Pulse: 85  86   Temp:  98.2 F (36.8 C)  98.3 F (36.8 C)  TempSrc:  Oral  Oral  Resp: 15  20   Height:      Weight:      SpO2: 93%  96%    Body mass index is 42.89 kg/(m^2).  General:  WDWN in NAD Gait: Normal HENT:  WNL Eyes: Pupils equal Pulmonary: normal non-labored breathing , without Rales, rhonchi,  wheezing Cardiac: RRR, without  Murmurs, rubs or gallops; No carotid bruits Abdomen: soft, NT, no masses Skin: no rashes, ulcers noted;  no Gangrene , no cellulitis; no open wounds;   Vascular Exam/Pulses:palpable right femoral and DP pulses, non pal/doppler left signals.  Left foot is cool to touch and sensation in decreased compared to right.  Active range of motion is intact on Bilateral LE.   Musculoskeletal: no muscle wasting or atrophy; no edema  Neurologic: A&O X 3; Appropriate Affect ;   MOTOR FUNCTION: 5/5 Symmetric Speech is fluent/normal   Significant Diagnostic Studies: CBC Lab Results  Component Value Date   WBC 7.5 04/14/2015   HGB 10.8* 04/14/2015   HCT 33.2* 04/14/2015   MCV 92.0 04/14/2015   PLT 187 04/14/2015    BMET    Component Value Date/Time   NA 139 04/14/2015 0447   K 4.6 04/14/2015 0447   CL 110 04/14/2015 0447   CO2 23 04/14/2015 0447   GLUCOSE 105* 04/14/2015 0447   BUN 8 04/14/2015 0447   CREATININE 1.13* 04/14/2015 0447   CALCIUM 8.8* 04/14/2015 0447   GFRNONAA 48* 04/14/2015 0447   GFRAA 56* 04/14/2015 0447   Estimated Creatinine Clearance: 55.1 mL/min (by C-G formula based on Cr of 1.13).  COAG No results found for: INR, PROTIME   Non-Invasive Vascular Imaging:   Bilateral lower extremity venous duplex completed. The right lower extremity is negative for deep vein thrombosis. The left lower extremity is positive for deep vein thrombosis involving the left popliteal, posterior tibial, and peroneal veins. There is no evidence of Baker's cyst bilaterally.  Lower Extremity Arterial Duplex has been completed.  The right common femoral artery exhibits triphasic waveforms. 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 There is no obvious evidence of hemodynamically significant  stenosis of the left lower extremity. 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  The ABI shows left iliac occlusion.  Pending CTA results for further recommendations.  Clinton [facility] 04/14/2015 11:54 AM agree with above assessment Patient developed claudication left buttock thigh and calf  3 days ago with no previous history of claudication. She had been having dyspnea for a few days. Admitted yesterday with DVT left popliteal and tibial veins and pulmonary embolus. On exam has absent left femoral pulse and no flow distally with good motion, no calf tenderness, some decreased sensation in left foot. Good dorsiflexion left foot. Left foot slightly cool compared to right. Duplex scan revealed probable obstruction and left external iliac artery. The pulses and contralateral right leg.  Impression acute DVT left leg with bilateral PE Onset of left leg claudication due to external iliac occlusion either from embolus or occlusive disease Outside possibility that patient suffered a paradoxical embolus at time of PE and then embolized left leg arterial system  Await results of CT angiogram

## 2015-04-14 NOTE — Progress Notes (Signed)
[place] TEAM 1 - Stepdown/ICU TEAM PROGRESS NOTE  Cassandra [name] AVW:[id number] DOB: Aug 31, 1945 DOA: 04/12/2015 PCP: Pcp Not In System  Admit HPI / Brief Narrative: 69 y.o.F Hx HTN who presented w/ c/o L leg pain that started around 23:00 on 04/11/15. Acute onset when ambulating from chair to bed. Associated w/ numbness in the foot but denies back pain.  Associated, SOB x3 days but no CP, cough, fevers, palpitations.  In the ED a CT noted large B central and lobar PE.    HPI/Subjective: The pt denies cp, sob, n/v, or abdom pain.  She continues to c/o LLE pain, cramping in nature, worse AFTER she walks and then rests.  She reports a prior hx of numbness and tingling in the same leg, which was evaluated by her PCP at some point in the past.  Her RN has noted the L leg is cooler than the R, and has not been able to doppler a pulse below the popliteal pulse.   Assessment/Plan:  Pulmonary embolism -CT noted large bilateral central and lobar PE, with right cardiac strain - D-dimer 14.52 - PESI class I -Heparin started in the ED - Dr. Joseph Art discussed pros and cons of coumadinv/s NOAC - patient chose Xarelto  -hypercoagulation workup after 6-12 months of anticoag completed   LLE pain - lack of pedal pulse -exam not c/w full ischemia (has sensation and movement in foot) but concerning for poor flow - also suspect LLE as source of DVT/PE - for venous and arterial dopplers this AM STAT - may require Vas Surg eval   Acute kidney failure - unknown recent baseline  -crt baseline 1.0 in 2012  -discontinue ACEI/ARB -crt improving w/ hydration   Hyperglycemia -185 on admission - no h/o DM - A1c 5.6 - likely simple stress reaction   Hypokalemia  -corrected   HTN -follow w/o change today   Dependent edema -Resolved  Code Status: FULL Family Communication: no family present at time of exam Disposition Plan: SDU  Consultants: none  Procedures: 12/5 CT angiogram chest PE protocol -  Large bilateral central and lobar pulmonary artery emboli with findings concerning for a degree of right cardiac straining. 12/6 TTE - LVEF 55%- 60%. - Right ventricle: moderately dilated - Right atrium: mildly dilated.- Pulmonary arteries: PA peak pressure: 39 mm Hg (S).  Antibiotics: none  DVT prophylaxis: Xarelto  Objective: Blood pressure 147/79, pulse 86, temperature 98.3 F (36.8 C), temperature source Oral, resp. rate 20, height  (1.6 m), weight 109.8 kg (242 lb 1 oz), SpO2 96 %.  Intake/Output Summary (Last 24 hours) at 04/14/15 0827 Last data filed at 04/14/15 0500  Gross per 24 hour  Intake 1848.6 ml  Output    950 ml  Net  898.6 ml   Exam: General: No acute respiratory distress - alert and pleasant  Lungs: Clear to auscultation bilaterally without wheezes or crackles Cardiovascular: Regular rate and rhythm without murmur gallop or rub normal S1 and S2 Abdomen: Nontender, nondistended, soft, bowel sounds positive, no rebound, no ascites, no appreciable mass Extremities: No significant clubbing, or edema bilateral lower extremities - L LE below knee is somewhat cooler to touch than the R - no palplabe pule - intact to sensation by touch to toes - intact muscle control and strength B LE   Data Reviewed: Basic Metabolic Panel:  Recent Labs Lab 04/12/15 0230 04/12/15 0928 04/14/15 0447  NA 133*  --  139  K 3.2*  --  4.6  CL 101  --  110  CO2 20*  --  23  GLUCOSE 185*  --  105*  BUN 14  --  8  CREATININE 1.36*  --  1.13*  CALCIUM 8.8*  --  8.8*  MG  --  1.5* 1.7  PHOS  --  3.3  --     CBC:  Recent Labs Lab 04/12/15 0230 04/13/15 0600 04/14/15 0447  WBC 9.9 6.1 7.5  NEUTROABS 7.6  --   --   HGB 12.4 10.2* 10.8*  HCT 36.1 30.6* 33.2*  MCV 89.6 91.1 92.0  PLT 141* 142* 187    Liver Function Tests:  Recent Labs Lab 04/14/15 0447  AST 43*  ALT 25  ALKPHOS 67  BILITOT 0.4  PROT 6.6  ALBUMIN 3.0*   Cardiac Enzymes:  Recent Labs Lab  04/12/15 0928 04/12/15 1826 04/13/15 0058 04/13/15 0600  TROPONINI 0.09* 0.07* 0.07* 0.07*    CBG:  Recent Labs Lab 04/12/15 1214 04/12/15 1644 04/12/15 2334  GLUCAP 95 137* 103*    Recent Results (from the past 240 hour(s))  MRSA PCR Screening     Status: None   Collection Time: 04/12/15  6:39 AM  Result Value Ref Range Status   MRSA by PCR NEGATIVE NEGATIVE Final    Comment:        The GeneXpert MRSA Assay (FDA approved for NASAL specimens only), is one component of a comprehensive MRSA colonization surveillance program. It is not intended to diagnose MRSA infection nor to guide or monitor treatment for MRSA infections.      Studies:   Recent x-ray studies have been reviewed in detail by the Attending Physician  Scheduled Meds:  Scheduled Meds: . antiseptic oral rinse  7 mL Mouth Rinse BID  . potassium chloride  40 mEq Oral BID  . Rivaroxaban  15 mg Oral BID WC  . sodium chloride  3 mL Intravenous Q12H    Time spent on care of this patient: 35 mins   Cassandra [name] , MD   [facility] Office  [phone] Pager - Text Page per Loretha StaplerAmion as per below:  On-Call/Text Page:      [email or web address]      password [email or web address]  If 7PM-7AM, please contact night-coverage [email or web address] Password [email or web address] 04/14/2015, 8:27 AM   LOS: 2 days

## 2015-04-14 NOTE — Progress Notes (Signed)
Unable to find pts dopplered pulse to LLE. Had a second nurse verify. Notified Dr. Sharon SellerMcClung. New orders given. Will continue to monitor pt.

## 2015-04-15 DIAGNOSIS — N179 Acute kidney failure, unspecified: Secondary | ICD-10-CM | POA: Diagnosis present

## 2015-04-15 DIAGNOSIS — I82402 Acute embolism and thrombosis of unspecified deep veins of left lower extremity: Secondary | ICD-10-CM

## 2015-04-15 DIAGNOSIS — N189 Chronic kidney disease, unspecified: Secondary | ICD-10-CM

## 2015-04-15 DIAGNOSIS — I509 Heart failure, unspecified: Secondary | ICD-10-CM

## 2015-04-15 LAB — RENAL FUNCTION PANEL
ANION GAP: 7 (ref 5–15)
Albumin: 3 g/dL — ABNORMAL LOW (ref 3.5–5.0)
BUN: 7 mg/dL (ref 6–20)
CHLORIDE: 110 mmol/L (ref 101–111)
CO2: 22 mmol/L (ref 22–32)
Calcium: 8.9 mg/dL (ref 8.9–10.3)
Creatinine, Ser: 1.16 mg/dL — ABNORMAL HIGH (ref 0.44–1.00)
GFR, EST AFRICAN AMERICAN: 54 mL/min — AB (ref >60)
GFR, EST NON AFRICAN AMERICAN: 47 mL/min — AB (ref >60)
Glucose, Bld: 92 mg/dL (ref 65–99)
PHOSPHORUS: 2.8 mg/dL (ref 2.5–4.6)
POTASSIUM: 4.8 mmol/L (ref 3.5–5.1)
Sodium: 139 mmol/L (ref 135–145)

## 2015-04-15 LAB — CBC
HEMATOCRIT: 32.8 % — AB (ref 36.0–46.0)
HEMOGLOBIN: 10.7 g/dL — AB (ref 12.0–15.0)
MCH: 30 pg (ref 26.0–34.0)
MCHC: 32.6 g/dL (ref 30.0–36.0)
MCV: 91.9 fL (ref 78.0–100.0)
Platelets: 187 10*3/uL (ref 150–400)
RBC: 3.57 MIL/uL — AB (ref 3.87–5.11)
RDW: 14.2 % (ref 11.5–15.5)
WBC: 6.8 10*3/uL (ref 4.0–10.5)

## 2015-04-15 LAB — APTT
APTT: 49 s — AB (ref 24–37)
APTT: 63 s — AB (ref 24–37)
aPTT: 42 seconds — ABNORMAL HIGH (ref 24–37)

## 2015-04-15 LAB — HEPARIN LEVEL (UNFRACTIONATED): Heparin Unfractionated: >2.2 IU/mL — ABNORMAL HIGH (ref 0.30–0.70)

## 2015-04-15 MED ORDER — DEXTROSE 5 % IV SOLN
1.5000 g | INTRAVENOUS | Status: AC
  Administered 2015-04-16: 1.5 g via INTRAVENOUS
  Filled 2015-04-15: qty 1.5

## 2015-04-15 NOTE — Progress Notes (Signed)
[place] TEAM 1 - Stepdown/ICU TEAM Progress Note  ZENORA KARPEL OZH:[id number] DOB: 1945/12/22 DOA: 04/12/2015 PCP: Pcp Not In System  Admit HPI / Brief Narrative: 69 y.o.BF PMHx  HTN Leg pain. L leg. Started around 23:00 on 04/11/15. Acute onset when ambulating from chair to bed. L hip radiates down to foot. Associated w/ numbness in the foot but denies back pain. Ibuprofen w/o relief. Worse w/ ambulation. Constant. No loss of bowel or bladder function or saddle anesthesia. No recent changes to medications. Associated, SOB x3 days but no CP, cough, fevers, palpitations. Recent   Echo and CXR from PCP a couple days ago were nml.   HPI/Subjective: 12/8 A/O 4, NAD, negative SOB, negative lower extremity pain. States no family history of hypercoagulability, negative recent injuries, negative long trips, negative previous PE/DVT  Assessment/Plan: Pulmonary embolism: - CT scan showing large bilateral central and lobar PE, with right cardiac strain. D-dimer 14.52. PESI class I.  -Heparin started in the ED. discussed pros and cons of Coumadin  Vs NOAC. Patient has chosen Xarelto  -Start Xarelto  - Initiate hypercoagulation workup once off anticoagulation in 3-6 mo - protein C, Protein S, antithrombin, Factor V, prothrombin - O2 PRN comfort - Echocardiogram; C/W right heart strain  Positive for DVT Lt popliteal, posterior tibial, and peroneal veins. -Surgery by vascular femoropopliteal 12/9  AKI vs CKD: Cr 1.36. Previous 1.0 in 2012.  - Normal saline 100 ml/hr  - Discontinue ACEI/ARB -Hold all nephrotoxic medication -Cr improved  Hyperglycemia:  -185 on admission. No h/o DM. - 12/5 hemoglobin A1c= 5.6  Hypokalemia:  -Resolved   HTN: -See AKI -If BP medication needed would start on beta blocker, CCB or hydralazine  Dependent edema: -Resolved   Code Status: FULL Family Communication: no family present at time of exam Disposition Plan:  SNF?    Consultants: NA  Procedure/Significant Events: 12/5 CT angiogram chest PE protocol;Large bilateral central and lobar pulmonary artery emboli with findings concerning for a degree of right cardiac straining. 12/6 echocardiogram;- LVEF 55%- 60%. - Right ventricle: moderately dilated.  - Right atrium: mildly dilated.- Pulmonary arteries: PA peak pressure: 39 mm Hg (S). 12/7 bilateral lower extremity Doppler ultrasound-Rt lower extremity is negative DVT  -Lt lower extremity is positive for DVT Lt popliteal, posterior tibial, and peroneal veins.    Culture NA  Antibiotics:   DVT prophylaxis: Heparin drip   Devices NA   LINES / TUBES:  NA    Continuous Infusions: . sodium chloride 100 mL/hr at 04/15/15 1500  . heparin 1,150 Units/hr (04/15/15 1500)    Objective: VITAL SIGNS: Temp: 98.3 F (36.8 C) (12/08 1639) Temp Source: Oral (12/08 1639) BP: 139/67 mmHg (12/08 1618) Pulse Rate: 72 (12/08 1618) SPO2; FIO2:   Intake/Output Summary (Last 24 hours) at 04/15/15 1909 Last data filed at 04/15/15 1500  Gross per 24 hour  Intake 2247.67 ml  Output   1200 ml  Net 1047.67 ml     Exam: General: A/O 4, NAD, No acute respiratory distress Eyes: Negative headache, eye pain, double vision,negative scleral hemorrhage ENT: Negative Runny nose, negative ear pain, negative gingival bleeding, Neck:  Negative scars, masses, torticollis, lymphadenopathy, JVD Lungs: Clear to auscultation bilaterally without wheezes or crackles Cardiovascular: Regular rate and rhythm without murmur gallop or rub normal S1 and S2 Abdomen:negative abdominal pain, nondistended, positive soft, bowel sounds, no rebound, no ascites, no appreciable mass Extremities: No significant cyanosis, clubbing, or edema bilateral lower extremities. Pain to palpation left medial thigh  Psychiatric:  Negative depression, negative anxiety, negative fatigue, negative mania  Neurologic:  Cranial nerves II  through XII intact, tongue/uvula midline, all extremities muscle strength 5/5, sensation intact throughout,  negative dysarthria, negative expressive aphasia, negative receptive aphasia.   Data Reviewed: Basic Metabolic Panel:  Recent Labs Lab 04/12/15 0230 04/12/15 0928 04/14/15 0447 04/15/15 0231  NA 133*  --  139 139  K 3.2*  --  4.6 4.8  CL 101  --  110 110  CO2 20*  --  23 22  GLUCOSE 185*  --  105* 92  BUN 14  --  8 7  CREATININE 1.36*  --  1.13* 1.16*  CALCIUM 8.8*  --  8.8* 8.9  MG  --  1.5* 1.7  --   PHOS  --  3.3  --  2.8   Liver Function Tests:  Recent Labs Lab 04/14/15 0447 04/15/15 0231  AST 43*  --   ALT 25  --   ALKPHOS 67  --   BILITOT 0.4  --   PROT 6.6  --   ALBUMIN 3.0* 3.0*   No results for input(s): LIPASE, AMYLASE in the last 168 hours. No results for input(s): AMMONIA in the last 168 hours. CBC:  Recent Labs Lab 04/12/15 0230 04/13/15 0600 04/14/15 0447 04/15/15 0231  WBC 9.9 6.1 7.5 6.8  NEUTROABS 7.6  --   --   --   HGB 12.4 10.2* 10.8* 10.7*  HCT 36.1 30.6* 33.2* 32.8*  MCV 89.6 91.1 92.0 91.9  PLT 141* 142* 187 187   Cardiac Enzymes:  Recent Labs Lab 04/12/15 0928 04/12/15 1826 04/13/15 0058 04/13/15 0600  TROPONINI 0.09* 0.07* 0.07* 0.07*   BNP (last 3 results) No results for input(s): BNP in the last 8760 hours.  ProBNP (last 3 results) No results for input(s): PROBNP in the last 8760 hours.  CBG:  Recent Labs Lab 04/12/15 1214 04/12/15 1644 04/12/15 2334  GLUCAP 95 137* 103*    Recent Results (from the past 240 hour(s))  MRSA PCR Screening     Status: None   Collection Time: 04/12/15  6:39 AM  Result Value Ref Range Status   MRSA by PCR NEGATIVE NEGATIVE Final    Comment:        The GeneXpert MRSA Assay (FDA approved for NASAL specimens only), is one component of a comprehensive MRSA colonization surveillance program. It is not intended to diagnose MRSA infection nor to guide or monitor treatment  for MRSA infections.      Studies:  Recent x-ray studies have been reviewed in detail by the Attending Physician  Scheduled Meds:  Scheduled Meds: . antiseptic oral rinse  7 mL Mouth Rinse BID  . [START ON 04/16/2015] cefUROXime (ZINACEF)  IV  1.5 g Intravenous To SS-Surg  . sodium chloride  3 mL Intravenous Q12H    Time spent on care of this patient: 40 mins   Desmond Tufano, Roselind MessierURTIS J , MD  [facility] Office  [phone] Pager - [phone]  On-Call/Text Page:      [email or web address]      password [email or web address]  If 7PM-7AM, please contact night-coverage [email or web address] Password [email or web address] 04/15/2015, 7:09 PM   LOS: 3 days   Care during the described time interval was provided by me .  I have reviewed this patient's available data, including medical history, events of note, physical examination, and all test results as part of my evaluation. I have personally reviewed and interpreted all radiology studies.   Carolyne Littlesurtis Jahquez Steffler, MD  [phone] Pager

## 2015-04-15 NOTE — Progress Notes (Signed)
   No change overnight.  Await surgery tomorrow.

## 2015-04-15 NOTE — Progress Notes (Signed)
ANTICOAGULATION CONSULT NOTE - Initial Consult  Pharmacy Consult:  Xarelto >> Heparin Indication: pulmonary embolus  No Known Allergies  Patient Measurements: Height: 5\' 3"  (160 cm) Weight: 242 lb 1 oz (109.8 kg) IBW/kg (Calculated) : 52.4 Heparin Dosing Weight: 79 kg  Vital Signs: Temp: 98.6 F (37 C) (12/08 0700) Temp Source: Oral (12/08 0700) BP: 126/61 mmHg (12/08 0811) Pulse Rate: 70 (12/08 0811)  Labs:  Recent Labs  04/12/15 1826 04/13/15 0058  04/13/15 0600 04/13/15 0920 04/14/15 0447 04/15/15 0231 04/15/15 0239  HGB  --   --   < > 10.2*  --  10.8* 10.7*  --   HCT  --   --   --  30.6*  --  33.2* 32.8*  --   PLT  --   --   --  142*  --  187 187  --   APTT  --   --   --   --   --   --  42*  --   HEPARINUNFRC  --  0.98*  --   --  0.72*  --   --  >2.20*  CREATININE  --   --   --   --   --  1.13* 1.16*  --   TROPONINI 0.07* 0.07*  --  0.07*  --   --   --   --   < > = values in this interval not displayed.  Estimated Creatinine Clearance: 53.7 mL/min (by C-G formula based on Cr of 1.16).   Medical History: Past Medical History  Diagnosis Date  . Hypertension   . Arthritis of knee     Assessment: [age] Y[facility] with leg pain and SOB, found to have large bilateral PE with right heart strain and was started on IV heparin.  [facility] and was started on Xarelto on 04/13/15, which she received 2 doses (last dose 04/14/15 at 0730).  Now to transition back to IV heparin for further procedures.  Doppler also confirmed LLE DVT.  Labs reviewed.  Will be using aPTT to assess heparin therapy as Xarelto may falsely elevated heparin levels.   Planning exploration of L politeal artery, L femoral artery 12/9 AM. 49 APTT subtherapeutic (49) on 700 units/h. HLs not yet correlating. Hg stable 10.7, plt wnl. No bleed/IV line issues per RN.  Goal of Therapy:  Heparin level 0.3-0.7 units/ml  APTT 66-102 s Monitor platelets by  anticoagulation protocol: Yes    Plan:  - Increase heparin gtt to 1150 units/hr - Check 8 hr aPTT / daily HL/aPTT/CBC - Mon s/sx bleeding - Exploratory procedure 12/9   Babs BertinHaley Ayelen Sciortino, PharmD, BCPS Clinical Pharmacist Pager [phone] 04/15/2015 10:31 AM

## 2015-04-15 NOTE — Progress Notes (Signed)
OT Cancellation Note  Patient Details Name: Cassandra [name] MRN: [id number] DOB: 1945/11/14   Cancelled Treatment:    Reason Eval/Treat Not Completed: Medical issues which prohibited therapy (Plan for surgery tomorrow. Will assess when medically appropriate.  Cape Cod Asc LLCWARD,[name]  Annalucia Laino, OTR/L  [phone] 04/15/2015 04/15/2015, 12:05 PM

## 2015-04-15 NOTE — Progress Notes (Signed)
ANTICOAGULATION CONSULT NOTE - Follow Up Consult  Pharmacy Consult for heparin Indication: pulmonary embolus and DVT  Labs:  Recent Labs  04/12/15 1826 04/13/15 0058  04/13/15 0600 04/13/15 0920 04/14/15 0447 04/15/15 0231 04/15/15 0239  HGB  --   --   < > 10.2*  --  10.8* 10.7*  --   HCT  --   --   --  30.6*  --  33.2* 32.8*  --   PLT  --   --   --  142*  --  187 187  --   APTT  --   --   --   --   --   --  42*  --   HEPARINUNFRC  --  0.98*  --   --  0.72*  --   --  >2.20*  CREATININE  --   --   --   --   --  1.13* 1.16*  --   TROPONINI 0.07* 0.07*  --  0.07*  --   --   --   --   < > = values in this interval not displayed.   Assessment: 69yo female subtherapeutic on heparin after resuming s/p two doses of Xarelto; previously needed low rate of heparin for therapeutic levels.  Goal of Therapy:  aPTT 66-102 seconds   Plan:  Will increase heparin gtt by 2 units/kg/hr to 900 units/hr and check PTT in 6hr.  Vernard GamblesVeronda Betsaida [place], PharmD, BCPS  04/15/2015,3:28 AM

## 2015-04-15 NOTE — Progress Notes (Signed)
ANTICOAGULATION CONSULT NOTE - Initial Consult  Pharmacy Consult:  Xarelto >> Heparin Indication: pulmonary embolus  No Known Allergies  Patient Measurements: Height: 5\' 3"  (160 cm) Weight: 242 lb 1 oz (109.8 kg) IBW/kg (Calculated) : 52.4 Heparin Dosing Weight: 79 kg  Vital Signs: Temp: 98.3 F (36.8 C) (12/08 1639) Temp Source: Oral (12/08 1639) BP: 139/67 mmHg (12/08 1618) Pulse Rate: 72 (12/08 1618)  Labs:  Recent Labs  04/13/15 0058  04/13/15 0600 04/13/15 0920 04/14/15 0447 04/15/15 0231 04/15/15 0239 04/15/15 1010 04/15/15 1917  HGB  --   < > 10.2*  --  10.8* 10.7*  --   --   --   HCT  --   --  30.6*  --  33.2* 32.8*  --   --   --   PLT  --   --  142*  --  187 187  --   --   --   APTT  --   --   --   --   --  42*  --  49* 63*  HEPARINUNFRC 0.98*  --   --  0.72*  --   --  >2.20*  --   --   CREATININE  --   --   --   --  1.13* 1.16*  --   --   --   TROPONINI 0.07*  --  0.07*  --   --   --   --   --   --   < > = values in this interval not displayed.  Estimated Creatinine Clearance: 53.7 mL/min (by C-G formula based on Cr of 1.16).   Medical History: Past Medical History  Diagnosis Date  . Hypertension   . Arthritis of knee     Assessment: [age] [facility] pain and SOB, found to have large bilateral PE with right heart strain and was started on IV heparin.  [facility] and was started on Xarelto on 04/13/15, which she received 2 doses (last dose 04/14/15 at 0730).  Now to transition back to IV heparin for further procedures.  Doppler also confirmed LLE DVT.  Labs reviewed.  Will be using aPTT to assess heparin therapy as Xarelto may falsely elevated heparin levels.   Planning exploration of L politeal artery, L femoral artery 12/9 AM. 49 APTT subtherapeutic (49) on 700 units/h. HLs not yet correlating. Hg stable 10.7, plt wnl. No bleed/IV line issues per RN.  Goal of Therapy:  Heparin level 0.3-0.7 units/ml   APTT 66-102 s Monitor platelets by anticoagulation protocol: Yes    Plan:  - Increase heparin gtt to 1150 units/hr - Check 8 hr aPTT / daily HL/aPTT/CBC - Mon s/sx bleeding - Exploratory procedure 12/9  Babs BertinHaley Baird, PharmD, BCPS Clinical Pharmacist Pager [phone]   Addendum -aptt is subtherapeutic -increase rate to 1300 units/hr -check next level with AM labs   MastersDarl Householder, Courtland Coppa M  04/15/2015 .8:28 PM

## 2015-04-15 NOTE — Progress Notes (Signed)
UR COMPLETED  

## 2015-04-15 NOTE — Progress Notes (Signed)
PT Cancellation Note  Patient Details Name: Cassandra [name] MRN: [id number] DOB: 1945/06/15   Cancelled Treatment:    Reason Eval/Treat Not Completed: Medical issues which prohibited therapy. Per chart review Left foot is pulseless and cool compared to the right lower extremity.  Plan is for exploration Lt popliteal and fem artery tomorrow (12/8).  PT will continue to follow.  Michail JewelsAshley Parr PT, DPT [phone] Pager: [phone] 04/15/2015, 11:28 AM

## 2015-04-15 NOTE — Progress Notes (Signed)
      Patient comfortable without SOB and no increased left foot or leg pain.  Active range of motion intact toes and ankle with increased warmth to touch on the left foot. Doppler signals absent on the left, right palpable DP and biphasic doppler on the right  On heparin drip and Xarelto on hold for pre-op planning  Plan exploration left popliteal artery and possibly left femoral artery by Dr. Harrell GaveLawson  Deronte Solis [facility]MAUREEN PA-C

## 2015-04-16 ENCOUNTER — Inpatient Hospital Stay (HOSPITAL_COMMUNITY): Payer: Medicare Other | Admitting: Certified Registered Nurse Anesthetist

## 2015-04-16 ENCOUNTER — Encounter (HOSPITAL_COMMUNITY)
Admission: EM | Disposition: A | Discharge status: Home Health | Payer: Self-pay | Source: Home / Self Care | Attending: Internal Medicine

## 2015-04-16 ENCOUNTER — Telehealth: Payer: Self-pay | Admitting: Vascular Surgery

## 2015-04-16 ENCOUNTER — Encounter (HOSPITAL_COMMUNITY): Payer: Self-pay | Admitting: Certified Registered Nurse Anesthetist

## 2015-04-16 ENCOUNTER — Inpatient Hospital Stay (HOSPITAL_COMMUNITY): Payer: Medicare Other

## 2015-04-16 DIAGNOSIS — I97621 Postprocedural hematoma of a circulatory system organ or structure following other procedure: Secondary | ICD-10-CM

## 2015-04-16 DIAGNOSIS — I2699 Other pulmonary embolism without acute cor pulmonale: Secondary | ICD-10-CM

## 2015-04-16 DIAGNOSIS — I7092 Chronic total occlusion of artery of the extremities: Secondary | ICD-10-CM

## 2015-04-16 DIAGNOSIS — M6289 Other specified disorders of muscle: Secondary | ICD-10-CM

## 2015-04-16 HISTORY — PX: PATCH ANGIOPLASTY: SHX6230

## 2015-04-16 HISTORY — PX: HEMATOMA EVACUATION: SHX5118

## 2015-04-16 HISTORY — PX: EMBOLECTOMY: SHX44

## 2015-04-16 LAB — CBC
HCT: 32 % — ABNORMAL LOW (ref 36.0–46.0)
HEMOGLOBIN: 10.8 g/dL — AB (ref 12.0–15.0)
MCH: 30.9 pg (ref 26.0–34.0)
MCHC: 33.8 g/dL (ref 30.0–36.0)
MCV: 91.4 fL (ref 78.0–100.0)
Platelets: 216 10*3/uL (ref 150–400)
RBC: 3.5 MIL/uL — AB (ref 3.87–5.11)
RDW: 14.3 % (ref 11.5–15.5)
WBC: 7.3 10*3/uL (ref 4.0–10.5)

## 2015-04-16 LAB — APTT
aPTT: 132 seconds — ABNORMAL HIGH (ref 24–37)
aPTT: 30 seconds (ref 24–37)

## 2015-04-16 LAB — PROTIME-INR
INR: 1.36 (ref 0.00–1.49)
Prothrombin Time: 16.9 seconds — ABNORMAL HIGH (ref 11.6–15.2)

## 2015-04-16 LAB — BASIC METABOLIC PANEL
ANION GAP: 7 (ref 5–15)
BUN: 6 mg/dL (ref 6–20)
CALCIUM: 8.6 mg/dL — AB (ref 8.9–10.3)
CHLORIDE: 111 mmol/L (ref 101–111)
CO2: 21 mmol/L — AB (ref 22–32)
Creatinine, Ser: 1.2 mg/dL — ABNORMAL HIGH (ref 0.44–1.00)
GFR calc non Af Amer: 45 mL/min — ABNORMAL LOW (ref >60)
GFR, EST AFRICAN AMERICAN: 52 mL/min — AB (ref >60)
Glucose, Bld: 96 mg/dL (ref 65–99)
Potassium: 4.2 mmol/L (ref 3.5–5.1)
SODIUM: 139 mmol/L (ref 135–145)

## 2015-04-16 LAB — ABO/RH: ABO/RH(D): B POS

## 2015-04-16 LAB — HEPARIN LEVEL (UNFRACTIONATED): Heparin Unfractionated: 0.97 IU/mL — ABNORMAL HIGH (ref 0.30–0.70)

## 2015-04-16 SURGERY — EMBOLECTOMY
Anesthesia: General | Site: Leg Upper | Laterality: Left

## 2015-04-16 SURGERY — EVACUATION HEMATOMA
Anesthesia: General | Site: Leg Lower | Laterality: Left

## 2015-04-16 MED ORDER — LIDOCAINE HCL (CARDIAC) 20 MG/ML IV SOLN
INTRAVENOUS | Status: AC
  Filled 2015-04-16: qty 5

## 2015-04-16 MED ORDER — PHENYLEPHRINE HCL 10 MG/ML IJ SOLN
10.0000 mg | INTRAVENOUS | Status: DC | PRN
  Administered 2015-04-16: 10 ug/min via INTRAVENOUS

## 2015-04-16 MED ORDER — CISATRACURIUM BESYLATE 20 MG/10ML IV SOLN
INTRAVENOUS | Status: AC
  Filled 2015-04-16: qty 20

## 2015-04-16 MED ORDER — LACTATED RINGERS IV SOLN
INTRAVENOUS | Status: DC | PRN
  Administered 2015-04-16: 14:00:00 via INTRAVENOUS

## 2015-04-16 MED ORDER — MORPHINE SULFATE (PF) 2 MG/ML IV SOLN: 2.0000 mg | INTRAVENOUS | Status: DC | PRN

## 2015-04-16 MED ORDER — HEPARIN (PORCINE) IN NACL 100-0.45 UNIT/ML-% IJ SOLN: 1300.0000 [IU]/h | INTRAMUSCULAR | Status: DC

## 2015-04-16 MED ORDER — HEPARIN SODIUM (PORCINE) 1000 UNIT/ML IJ SOLN
INTRAMUSCULAR | Status: DC | PRN
  Administered 2015-04-16: 6000 [IU] via INTRAVENOUS

## 2015-04-16 MED ORDER — LIDOCAINE HCL (CARDIAC) 20 MG/ML IV SOLN
INTRAVENOUS | Status: DC | PRN
  Administered 2015-04-16: 50 mg via INTRAVENOUS

## 2015-04-16 MED ORDER — OXYCODONE HCL 5 MG PO TABS: 5.0000 mg | ORAL_TABLET | Freq: Once | ORAL | Status: DC | PRN

## 2015-04-16 MED ORDER — DEXAMETHASONE SODIUM PHOSPHATE 10 MG/ML IJ SOLN
INTRAMUSCULAR | Status: DC | PRN
  Administered 2015-04-16: 4 mg via INTRAVENOUS

## 2015-04-16 MED ORDER — ONDANSETRON HCL 4 MG/2ML IJ SOLN
INTRAMUSCULAR | Status: DC | PRN
  Administered 2015-04-16: 4 mg via INTRAVENOUS

## 2015-04-16 MED ORDER — PROPRANOLOL HCL 1 MG/ML IV SOLN
INTRAVENOUS | Status: AC
  Filled 2015-04-16: qty 1

## 2015-04-16 MED ORDER — PHENYLEPHRINE HCL 10 MG/ML IJ SOLN
INTRAMUSCULAR | Status: DC | PRN
  Administered 2015-04-16 (×2): 40 ug via INTRAVENOUS

## 2015-04-16 MED ORDER — IOHEXOL 300 MG/ML  SOLN
INTRAMUSCULAR | Status: DC | PRN
  Administered 2015-04-16: 27 mL via INTRAVENOUS

## 2015-04-16 MED ORDER — HEMOSTATIC AGENTS (NO CHARGE) OPTIME
TOPICAL | Status: DC | PRN
  Administered 2015-04-16: 1 via TOPICAL

## 2015-04-16 MED ORDER — SODIUM CHLORIDE 0.9 % IV SOLN
INTRAVENOUS | Status: DC | PRN
  Administered 2015-04-16: 500 mL

## 2015-04-16 MED ORDER — LIDOCAINE HCL (CARDIAC) 20 MG/ML IV SOLN
INTRAVENOUS | Status: DC | PRN
  Administered 2015-04-16: 60 mg via INTRAVENOUS

## 2015-04-16 MED ORDER — PHENYLEPHRINE HCL 10 MG/ML IJ SOLN
INTRAMUSCULAR | Status: DC | PRN
  Administered 2015-04-16 (×2): 40 ug via INTRAVENOUS
  Administered 2015-04-16: 120 ug via INTRAVENOUS

## 2015-04-16 MED ORDER — SODIUM CHLORIDE 0.9 % IV SOLN
INTRAVENOUS | Status: DC | PRN
  Administered 2015-04-16: 13:00:00

## 2015-04-16 MED ORDER — HEPARIN (PORCINE) IN NACL 100-0.45 UNIT/ML-% IJ SOLN
800.0000 [IU]/h | INTRAMUSCULAR | Status: DC
  Administered 2015-04-16: 800 [IU]/h via INTRAVENOUS
  Filled 2015-04-16 (×5): qty 250

## 2015-04-16 MED ORDER — PHENYLEPHRINE 40 MCG/ML (10ML) SYRINGE FOR IV PUSH (FOR BLOOD PRESSURE SUPPORT)
PREFILLED_SYRINGE | INTRAVENOUS | Status: AC
  Filled 2015-04-16: qty 10

## 2015-04-16 MED ORDER — HEPARIN SODIUM (PORCINE) 1000 UNIT/ML IJ SOLN
INTRAMUSCULAR | Status: AC
  Filled 2015-04-16: qty 1

## 2015-04-16 MED ORDER — 0.9 % SODIUM CHLORIDE (POUR BTL) OPTIME
TOPICAL | Status: DC | PRN
  Administered 2015-04-16: 2000 mL

## 2015-04-16 MED ORDER — SUCCINYLCHOLINE CHLORIDE 20 MG/ML IJ SOLN
INTRAMUSCULAR | Status: AC
  Filled 2015-04-16: qty 1

## 2015-04-16 MED ORDER — DEXTROSE 5 % IV SOLN
10.0000 mg | INTRAVENOUS | Status: DC | PRN
  Administered 2015-04-16: 10 ug/min via INTRAVENOUS

## 2015-04-16 MED ORDER — FENTANYL CITRATE (PF) 250 MCG/5ML IJ SOLN
INTRAMUSCULAR | Status: AC
  Filled 2015-04-16: qty 5

## 2015-04-16 MED ORDER — MIDAZOLAM HCL 2 MG/2ML IJ SOLN
INTRAMUSCULAR | Status: AC
  Filled 2015-04-16: qty 2

## 2015-04-16 MED ORDER — CEFAZOLIN SODIUM-DEXTROSE 2-3 GM-% IV SOLR
INTRAVENOUS | Status: DC | PRN
  Administered 2015-04-16: 2 g via INTRAVENOUS

## 2015-04-16 MED ORDER — FENTANYL CITRATE (PF) 100 MCG/2ML IJ SOLN
INTRAMUSCULAR | Status: DC | PRN
  Administered 2015-04-16: 100 ug via INTRAVENOUS
  Administered 2015-04-16: 50 ug via INTRAVENOUS

## 2015-04-16 MED ORDER — SUCCINYLCHOLINE CHLORIDE 20 MG/ML IJ SOLN
INTRAMUSCULAR | Status: DC | PRN
  Administered 2015-04-16: 100 mg via INTRAVENOUS

## 2015-04-16 MED ORDER — PROMETHAZINE HCL 25 MG/ML IJ SOLN: 6.2500 mg | INTRAMUSCULAR | Status: DC | PRN

## 2015-04-16 MED ORDER — DEXAMETHASONE SODIUM PHOSPHATE 4 MG/ML IJ SOLN
INTRAMUSCULAR | Status: AC
  Filled 2015-04-16: qty 1

## 2015-04-16 MED ORDER — EPHEDRINE SULFATE 50 MG/ML IJ SOLN
INTRAMUSCULAR | Status: AC
  Filled 2015-04-16: qty 1

## 2015-04-16 MED ORDER — PROPOFOL 10 MG/ML IV BOLUS
INTRAVENOUS | Status: AC
  Filled 2015-04-16: qty 20

## 2015-04-16 MED ORDER — ONDANSETRON HCL 4 MG/2ML IJ SOLN
INTRAMUSCULAR | Status: AC
  Filled 2015-04-16: qty 2

## 2015-04-16 MED ORDER — 0.9 % SODIUM CHLORIDE (POUR BTL) OPTIME
TOPICAL | Status: DC | PRN
  Administered 2015-04-16: 1000 mL

## 2015-04-16 MED ORDER — HYDROMORPHONE HCL 1 MG/ML IJ SOLN: 0.2500 mg | INTRAMUSCULAR | Status: DC | PRN

## 2015-04-16 MED ORDER — ACETAMINOPHEN 160 MG/5ML PO SOLN
325.0000 mg | ORAL | Status: DC | PRN
  Filled 2015-04-16: qty 20.3

## 2015-04-16 MED ORDER — GLYCOPYRROLATE 0.2 MG/ML IJ SOLN
INTRAMUSCULAR | Status: AC
Start: 2015-04-16 — End: 2015-04-16
  Filled 2015-04-16: qty 3

## 2015-04-16 MED ORDER — PROPOFOL 10 MG/ML IV BOLUS
INTRAVENOUS | Status: DC | PRN
  Administered 2015-04-16: 50 mg via INTRAVENOUS
  Administered 2015-04-16: 150 mg via INTRAVENOUS

## 2015-04-16 MED ORDER — DEXTROSE 5 % IV SOLN
1.5000 g | Freq: Two times a day (BID) | INTRAVENOUS | Status: AC
  Administered 2015-04-16 – 2015-04-17 (×2): 1.5 g via INTRAVENOUS
  Filled 2015-04-16 (×2): qty 1.5

## 2015-04-16 MED ORDER — NEOSTIGMINE METHYLSULFATE 10 MG/10ML IV SOLN
INTRAVENOUS | Status: AC
  Filled 2015-04-16: qty 1

## 2015-04-16 MED ORDER — LACTATED RINGERS IV SOLN
INTRAVENOUS | Status: DC | PRN
  Administered 2015-04-16: 13:00:00 via INTRAVENOUS

## 2015-04-16 MED ORDER — PROPRANOLOL HCL 1 MG/ML IV SOLN
INTRAVENOUS | Status: DC | PRN
  Administered 2015-04-16: .2 mg via INTRAVENOUS

## 2015-04-16 MED ORDER — FENTANYL CITRATE (PF) 100 MCG/2ML IJ SOLN
INTRAMUSCULAR | Status: DC | PRN
  Administered 2015-04-16 (×5): 50 ug via INTRAVENOUS

## 2015-04-16 MED ORDER — PROPOFOL 10 MG/ML IV BOLUS
INTRAVENOUS | Status: DC | PRN
  Administered 2015-04-16: 150 mg via INTRAVENOUS

## 2015-04-16 MED ORDER — OXYCODONE HCL 5 MG/5ML PO SOLN: 5.0000 mg | Freq: Once | ORAL | Status: DC | PRN

## 2015-04-16 MED ORDER — ROCURONIUM BROMIDE 50 MG/5ML IV SOLN
INTRAVENOUS | Status: AC
  Filled 2015-04-16: qty 1

## 2015-04-16 MED ORDER — NEOSTIGMINE METHYLSULFATE 10 MG/10ML IV SOLN
INTRAVENOUS | Status: DC | PRN
Start: 2015-04-16 — End: 2015-04-16
  Administered 2015-04-16: 1 mg via INTRAVENOUS
  Administered 2015-04-16: 4 mg via INTRAVENOUS

## 2015-04-16 MED ORDER — DOCUSATE SODIUM 100 MG PO CAPS
100.0000 mg | ORAL_CAPSULE | Freq: Every day | ORAL | Status: DC
  Administered 2015-04-17 – 2015-04-20 (×4): 100 mg via ORAL
  Filled 2015-04-16 (×4): qty 1

## 2015-04-16 MED ORDER — SODIUM CHLORIDE 0.9 % IV SOLN: 500.0000 mL | Freq: Once | INTRAVENOUS | Status: DC | PRN

## 2015-04-16 MED ORDER — GLYCOPYRROLATE 0.2 MG/ML IJ SOLN
INTRAMUSCULAR | Status: DC | PRN
  Administered 2015-04-16: 0.2 mg via INTRAVENOUS
  Administered 2015-04-16: 0.6 mg via INTRAVENOUS

## 2015-04-16 MED ORDER — CISATRACURIUM BESYLATE (PF) 10 MG/5ML IV SOLN
INTRAVENOUS | Status: DC | PRN
  Administered 2015-04-16: 4 mg via INTRAVENOUS
  Administered 2015-04-16: 6 mg via INTRAVENOUS

## 2015-04-16 MED ORDER — HEPARIN (PORCINE) IN NACL 100-0.45 UNIT/ML-% IJ SOLN: 800.0000 [IU]/h | INTRAMUSCULAR | Status: DC

## 2015-04-16 MED ORDER — ACETAMINOPHEN 325 MG PO TABS: 325.0000 mg | ORAL_TABLET | ORAL | Status: DC | PRN

## 2015-04-16 SURGICAL SUPPLY — 38 items
BANDAGE ELASTIC 6 VELCRO ST LF (GAUZE/BANDAGES/DRESSINGS) ×4 IMPLANT
BNDG GAUZE ELAST 4 BULKY (GAUZE/BANDAGES/DRESSINGS) ×4 IMPLANT
CANISTER SUCTION 2500CC (MISCELLANEOUS) ×4 IMPLANT
CLIP TI MEDIUM 24 (CLIP) ×4 IMPLANT
CLIP TI WIDE RED SMALL 24 (CLIP) ×4 IMPLANT
DERMABOND ADVANCED (GAUZE/BANDAGES/DRESSINGS) ×2
DERMABOND ADVANCED .7 DNX12 (GAUZE/BANDAGES/DRESSINGS) ×2 IMPLANT
DRAIN SNY 10X20 3/4 PERF (WOUND CARE) ×4 IMPLANT
ELECT REM PT RETURN 9FT ADLT (ELECTROSURGICAL) ×4
ELECTRODE REM PT RTRN 9FT ADLT (ELECTROSURGICAL) ×2 IMPLANT
EVACUATOR SILICONE 100CC (DRAIN) ×4 IMPLANT
GLOVE BIOGEL PI IND STRL 6.5 (GLOVE) ×4 IMPLANT
GLOVE BIOGEL PI IND STRL 7.5 (GLOVE) ×2 IMPLANT
GLOVE BIOGEL PI INDICATOR 6.5 (GLOVE) ×4
GLOVE BIOGEL PI INDICATOR 7.5 (GLOVE) ×2
GLOVE ECLIPSE 6.5 STRL STRAW (GLOVE) ×8 IMPLANT
GLOVE ECLIPSE 7.0 STRL STRAW (GLOVE) ×4 IMPLANT
GLOVE SS BIOGEL STRL SZ 7 (GLOVE) ×2 IMPLANT
GLOVE SUPERSENSE BIOGEL SZ 7 (GLOVE) ×2
GOWN STRL REUS W/ TWL LRG LVL3 (GOWN DISPOSABLE) ×6 IMPLANT
GOWN STRL REUS W/TWL LRG LVL3 (GOWN DISPOSABLE) ×6
HEMOSTAT SNOW SURGICEL 2X4 (HEMOSTASIS) ×4 IMPLANT
INSERT FOGARTY SM (MISCELLANEOUS) ×4 IMPLANT
KIT BASIN OR (CUSTOM PROCEDURE TRAY) ×4 IMPLANT
KIT ROOM TURNOVER OR (KITS) ×4 IMPLANT
NS IRRIG 1000ML POUR BTL (IV SOLUTION) ×8 IMPLANT
PACK PERIPHERAL VASCULAR (CUSTOM PROCEDURE TRAY) ×4 IMPLANT
PAD ARMBOARD 7.5X6 YLW CONV (MISCELLANEOUS) ×8 IMPLANT
SPONGE GAUZE 4X4 12PLY STER LF (GAUZE/BANDAGES/DRESSINGS) ×4 IMPLANT
SUT ETHILON 3 0 PS 1 (SUTURE) ×4 IMPLANT
SUT PROLENE 6 0 CC (SUTURE) ×4 IMPLANT
SUT VIC AB 2-0 CTX 36 (SUTURE) ×4 IMPLANT
SUT VIC AB 3-0 SH 27 (SUTURE) ×6
SUT VIC AB 3-0 SH 27X BRD (SUTURE) ×6 IMPLANT
SUT VICRYL 4-0 PS2 18IN ABS (SUTURE) ×4 IMPLANT
TAPE CLOTH SURG 6X10 WHT LF (GAUZE/BANDAGES/DRESSINGS) ×4 IMPLANT
UNDERPAD 30X30 INCONTINENT (UNDERPADS AND DIAPERS) ×4 IMPLANT
WATER STERILE IRR 1000ML POUR (IV SOLUTION) ×4 IMPLANT

## 2015-04-16 SURGICAL SUPPLY — 54 items
BANDAGE ESMARK 6X9 LF (GAUZE/BANDAGES/DRESSINGS) IMPLANT
BNDG ESMARK 6X9 LF (GAUZE/BANDAGES/DRESSINGS)
CANISTER SUCTION 2500CC (MISCELLANEOUS) ×3 IMPLANT
CATH EMB 3FR 80CM (CATHETERS) ×3 IMPLANT
CATH EMB 4FR 80CM (CATHETERS) ×3 IMPLANT
CATH EMB 5FR 80CM (CATHETERS) ×3 IMPLANT
CLIP TI MEDIUM 24 (CLIP) ×3 IMPLANT
CLIP TI WIDE RED SMALL 24 (CLIP) ×3 IMPLANT
CUFF TOURNIQUET SINGLE 18IN (TOURNIQUET CUFF) IMPLANT
CUFF TOURNIQUET SINGLE 24IN (TOURNIQUET CUFF) IMPLANT
CUFF TOURNIQUET SINGLE 34IN LL (TOURNIQUET CUFF) IMPLANT
CUFF TOURNIQUET SINGLE 44IN (TOURNIQUET CUFF) IMPLANT
DRAIN CHANNEL 10M FLAT 3/4 FLT (DRAIN) ×3 IMPLANT
DRAIN SNY 10X20 3/4 PERF (WOUND CARE) ×3 IMPLANT
DRAPE X-RAY CASS 24X20 (DRAPES) ×3 IMPLANT
ELECT REM PT RETURN 9FT ADLT (ELECTROSURGICAL) ×3
ELECTRODE REM PT RTRN 9FT ADLT (ELECTROSURGICAL) ×1 IMPLANT
EVACUATOR SILICONE 100CC (DRAIN) ×3 IMPLANT
GLOVE BIO SURGEON STRL SZ 6.5 (GLOVE) ×8 IMPLANT
GLOVE BIO SURGEON STRL SZ8 (GLOVE) ×3 IMPLANT
GLOVE BIO SURGEONS STRL SZ 6.5 (GLOVE) ×4
GLOVE BIOGEL PI IND STRL 6.5 (GLOVE) ×1 IMPLANT
GLOVE BIOGEL PI INDICATOR 6.5 (GLOVE) ×2
GLOVE SS BIOGEL STRL SZ 7 (GLOVE) ×1 IMPLANT
GLOVE SUPERSENSE BIOGEL SZ 7 (GLOVE) ×2
GOWN STRL REUS W/ TWL LRG LVL3 (GOWN DISPOSABLE) ×3 IMPLANT
GOWN STRL REUS W/TWL LRG LVL3 (GOWN DISPOSABLE) ×6
KIT BASIN OR (CUSTOM PROCEDURE TRAY) ×3 IMPLANT
KIT ROOM TURNOVER OR (KITS) ×3 IMPLANT
LIQUID BAND (GAUZE/BANDAGES/DRESSINGS) ×3 IMPLANT
NS IRRIG 1000ML POUR BTL (IV SOLUTION) ×6 IMPLANT
PACK PERIPHERAL VASCULAR (CUSTOM PROCEDURE TRAY) ×3 IMPLANT
PAD ARMBOARD 7.5X6 YLW CONV (MISCELLANEOUS) ×6 IMPLANT
PADDING CAST COTTON 6X4 STRL (CAST SUPPLIES) IMPLANT
PATCH VASC XENOSURE 1CMX6CM (Vascular Products) ×2 IMPLANT
PATCH VASC XENOSURE 1X6 (Vascular Products) ×1 IMPLANT
SET COLLECT BLD 21X3/4 12 (NEEDLE) ×3 IMPLANT
SPONGE GAUZE 4X4 12PLY STER LF (GAUZE/BANDAGES/DRESSINGS) ×3 IMPLANT
STAPLER VISISTAT 35W (STAPLE) ×3 IMPLANT
STOPCOCK 4 WAY LG BORE MALE ST (IV SETS) ×3 IMPLANT
SUT ETHILON 3 0 PS 1 (SUTURE) ×3 IMPLANT
SUT PROLENE 5 0 C 1 24 (SUTURE) ×3 IMPLANT
SUT PROLENE 6 0 BV (SUTURE) ×3 IMPLANT
SUT PROLENE 6 0 CC (SUTURE) ×3 IMPLANT
SUT VIC AB 2-0 CTX 36 (SUTURE) ×3 IMPLANT
SUT VIC AB 3-0 SH 27 (SUTURE) ×2
SUT VIC AB 3-0 SH 27X BRD (SUTURE) ×1 IMPLANT
SYR 3ML LL SCALE MARK (SYRINGE) ×6 IMPLANT
SYR TB 1ML LUER SLIP (SYRINGE) ×3 IMPLANT
TAPE CLOTH SURG 4X10 WHT LF (GAUZE/BANDAGES/DRESSINGS) ×3 IMPLANT
TRAY FOLEY W/METER SILVER 16FR (SET/KITS/TRAYS/PACK) ×3 IMPLANT
TUBING EXTENTION W/L.L. (IV SETS) ×3 IMPLANT
UNDERPAD 30X30 INCONTINENT (UNDERPADS AND DIAPERS) ×3 IMPLANT
WATER STERILE IRR 1000ML POUR (IV SOLUTION) ×3 IMPLANT

## 2015-04-16 NOTE — Op Note (Signed)
OPERATIVE REPORT  Date of Surgery: 04/12/2015 - 04/16/2015  Surgeon: Josephina GipJames Hope Brandenburger, MD  Assistant: Lianne CureMaureen Collins PA  Pre-op Diagnosis bleeding from left popliteal artery procedure  Post-op Diagnosis: Same  Procedure: Procedure(s): EVACUATION HEMATOMA FROM LEFT LEG POPLITEAL ARTERY EXPOSURE  Anesthesia: General  EBL: 200 cc  Complications: None  Procedure Details: The patient had previous surgery earlier in the day-embolectomy of popliteal artery and tibial vessels as well as left common iliac artery the left popliteal approach. She developed bleeding through her drain which was significant and she was returned urgently to the operating room for exploration. Patient was prepped and draped in routine sterile manner after induction of satisfactory general endotracheal anesthesia. The previously placed Jackson-Pratt drain was brought out to the distal thigh wound was removed and there was some significant bleeding out of that tract. This subsided. The incision below the knee was reopened and there was a hematoma of unclotted blood in the popliteal exposure wound which was probably 150-200 cc of blood. This was removed and the wound was thoroughly irrigated with saline. There was no active bleeding coming from the patch angioplasty site or any of the other areas of exposure or dissection. I thoroughly irrigated this with saline and observed this for 15 minutes. Also slightly enlarged the drain exit site in the distal thigh to see if bleeding would be coming from the saphenous vein or a large branch and there was no bleeding in this area. Hemostatic agent was then placed in the popliteal space and a new drain was brought out through an inferiorly based stab wound secured with a nylon suture. There was no bleeding in the tract. After thorough irrigation the wound was then closed in layers and interrupted 2-0 Vicryl interrupted 3-0 Vicryl and continuous 3-0 Vicryl subcuticular layer with Dermabond.  Sterile dressing applied patient taken to recovery room in satisfactory condition. She had excellent Doppler flow in her tibioperoneal trunk and anterior tibial artery in the procedure and palpable dorsalis pedis pulse at the conclusion of the procedure. Josephina GipJames Beyla Loney, MD 04/16/2015 1:49 PM

## 2015-04-16 NOTE — Telephone Encounter (Signed)
-----   Message from Phillips Odorarol S Pullins, RN sent at 04/16/2015 12:08 PM EST ----- Regarding: needs 2 wk. f/u with Dr. Hart RochesterLawson   ----- Message -----    From: Dara LordsSamantha J Rhyne, PA-C    Sent: 04/16/2015   9:40 AM      To: [facility]vs Charge Pool  S/p embolectomy left femoral, iliac, tibioperoneal trunk 04/16/15.  Fu with Dr. Hart RochesterLawson in 2 weeks.  Thanks, Lelon MastSamantha

## 2015-04-16 NOTE — Transfer of Care (Signed)
Immediate Anesthesia Transfer of Care Note  Patient: Cassandra [name]  Procedure(s) Performed: Procedure(s): Left LegTransPOPLITEAL EMBOLECTOMY of left iliac, left popliteal left anterior tibial, left tibial peroneal trunkPOSSIBLE FEMORAL EMBOLECTOMY (Left) LOWER EXTREMITY ANGIOGRAM (Left) Bovine PATCH ANGIOPLASTY (Left)  Patient Location: PACU  Anesthesia Type:General  Level of Consciousness: awake  Airway & Oxygen Therapy: Patient Spontanous Breathing and Patient connected to face mask oxygen  Post-op Assessment: Report given to RN and Post -op Vital signs reviewed and stable  Post vital signs: stable  Last Vitals:  Filed Vitals:   04/16/15 0324 04/16/15 0325  BP:  126/66  Pulse:  81  Temp: 37.1 C   Resp:  21    Complications: No apparent anesthesia complications

## 2015-04-16 NOTE — Progress Notes (Signed)
   Completed the surgical procedure without apparent complication.  Please call if we can be of further assistance.

## 2015-04-16 NOTE — Progress Notes (Signed)
ANTICOAGULATION CONSULT NOTE  Pharmacy Consult:  Xarelto >> Heparin Indication: pulmonary embolus  No Known Allergies  Patient Measurements: Height: 5\' 3"  (160 cm) Weight: 242 lb 1 oz (109.8 kg) IBW/kg (Calculated) : 52.4 Heparin Dosing Weight: 79 kg  Vital Signs: Temp: 97.7 F (36.5 C) (12/09 1136) Temp Source: Oral (12/09 1136) BP: 136/69 mmHg (12/09 1136) Pulse Rate: 58 (12/09 1136)  Labs:  Recent Labs  04/14/15 0447  04/15/15 0231 04/15/15 0239 04/15/15 1010 04/15/15 1917 04/16/15 0500  HGB 10.8*  --  10.7*  --   --   --  10.8*  HCT 33.2*  --  32.8*  --   --   --  32.0*  PLT 187  --  187  --   --   --  216  APTT  --   < > 42*  --  49* 63* 132*  LABPROT  --   --   --   --   --   --  16.9*  INR  --   --   --   --   --   --  1.36  HEPARINUNFRC  --   --   --  >2.20*  --   --  0.97*  CREATININE 1.13*  --  1.16*  --   --   --  1.20*  < > = values in this interval not displayed.  Estimated Creatinine Clearance: 51.9 mL/min (by C-G formula based on Cr of 1.2).   Medical History: Past Medical History  Diagnosis Date  . Hypertension   . Arthritis of knee     Assessment: [age] Y[facility] with leg pain and SOB, found to have large bilateral PE with right heart strain and was started on IV heparin.  [facility] and was started on Xarelto on 04/13/15, which she received 2 doses (last dose 04/14/15 at 0730).  Now to transition back to IV heparin for further procedures.  Doppler also confirmed LLE DVT.  Labs reviewed.  Will be using aPTT to assess heparin therapy as Xarelto may falsely elevated heparin levels.   S/p OR 12/9 for exploratory procedure. Prior to OR:  APTT subtherapeutic (63) on 1150 units/h and supratherapeutic (132) on 1300 units/h - but already taken to OR this AM prior to adjustment. HLs not yet correlating. Hg stable 10.7, plt wnl. No bleed/IV line issues per RN.  *Per Vasc Surg: Heparin reduced to 800 units/h to start  at 1800 post-OR. Then Pharmacy to increase back to full-dose tomorrow 12/10 @0500 .  Goal of Therapy:  Heparin level 0.3-0.7 units/ml  APTT 66-102 s Monitor platelets by anticoagulation protocol: Yes    Plan:  Heparin IV to restart at 800 units/h at 1800 tonight per Vasc Surg Pharmacy to increase back to full-dose 12/10 at 0500 Daily aPTT/HL/CBC Mon s/sx bleeding  Babs BertinHaley Mariyam Remington, PharmD, BCPS Clinical Pharmacist Pager [phone]

## 2015-04-16 NOTE — Progress Notes (Addendum)
Physician notified: Doreatha MassedSamantha Rhyne, PA At: 1209  Regarding: JP has put out 210mL frank red blood since return from PACU (1145), large clot noted in JP bulb--but still draining well with suction. Dressing saturated, changed x1. Orders for IV heparin to restart at 1100, not started from PACU, want restart now?  Awaiting return response.   Returned Response at: 1213  Order(s): Dr. Hart RochesterLawson will be to bedside.  Total output from JP 290mL while on 3S. Plan back to OR.  Consent signed. Pt escorted by anesthesia to OR at 1245. Family sent to surgical waiting room.

## 2015-04-16 NOTE — Telephone Encounter (Signed)
Unable to reach pt, phone # is disconnected. Mailed appt letter, dpm

## 2015-04-16 NOTE — Anesthesia Preprocedure Evaluation (Addendum)
Anesthesia Evaluation  Patient identified by MRN, date of birth, ID band Patient awake    Reviewed: Allergy & Precautions, NPO status , Patient's Chart, lab work & pertinent test results  History of Anesthesia Complications Negative for: history of anesthetic complications  Airway Mallampati: II  TM Distance: >3 FB Neck ROM: Full    Dental  (+) Poor Dentition, Missing, Upper Dentures, Chipped   Pulmonary PE   breath sounds clear to auscultation       Cardiovascular hypertension,  Rhythm:Regular Rate:Normal  reecent pulm embolism and Rheart strain   Neuro/Psych negative neurological ROS  negative psych ROS   GI/Hepatic   Endo/Other  Morbid obesity  Renal/GU Renal InsufficiencyRenal disease     Musculoskeletal  (+) Arthritis ,   Abdominal (+) + obese,   Peds  Hematology  (+) anemia ,       Component                Value               Date/Time                 WBC                      7.3                 04/16/2015 0500           RBC                      3.50*               04/16/2015 0500           HGB                      10.8*               04/16/2015 0500           HCT                      32.0*               04/16/2015 0500           PLT                      216                 04/16/2015 0500           LABPROT                  16.9*               04/16/2015 0500           INR                      1.36                04/16/2015 0500           APTT                     132*                04/16/2015 0500             Component  Value               Date/Time                 NA                       139                 04/16/2015 0500           K                        4.2                 04/16/2015 0500           CL                       111                 04/16/2015 0500           CO2                      21*                 04/16/2015 0500           BUN                      6                    04/16/2015 0500           CREATININE               1.20*               04/16/2015 0500           CALCIUM                  8.6*                04/16/2015 0500           ALKPHOS                  67                  04/14/2015 0447           AST                      43*                 04/14/2015 0447           ALT                      25                  04/14/2015 0447           BILITOT                  0.4                 04/14/2015 0447          Anesthesia Other Findings   Reproductive/Obstetrics  Anesthesia Physical Anesthesia Plan  ASA: III and emergent  Anesthesia Plan: General   Post-op Pain Management:    Induction: Intravenous  Airway Management Planned: Oral ETT  Additional Equipment: None  Intra-op Plan:   Post-operative Plan: Extubation in OR  Informed Consent: I have reviewed the patients History and Physical, chart, labs and discussed the procedure including the risks, benefits and alternatives for the proposed anesthesia with the patient or authorized representative who has indicated his/her understanding and acceptance.   Dental advisory given  Plan Discussed with: CRNA and Surgeon  Anesthesia Plan Comments:         Anesthesia Quick Evaluation

## 2015-04-16 NOTE — H&P (View-Only) (Signed)
PT Cancellation Note  Patient Details Name: Cassandra [name] MRN: [id number] DOB: 30-Nov-1945   Cancelled Treatment:    Reason Eval/Treat Not Completed: Medical issues which prohibited therapy.  Per RN lack of pulse Lt foot as of this am, pt is not currently appropriate for PT.  Will continue to follow.  Michail JewelsAshley Parr PT, DPT [phone] Pager: [phone] 04/14/2015, 1:11 PM

## 2015-04-16 NOTE — Interval H&P Note (Signed)
History and Physical Interval Note:  04/16/2015 7:29 AM  Dolphus JennyHelen H Aubuchon  has presented today for surgery, with the diagnosis of Peripheral arterial disease I70.92; Ischemic left lower extremity M62.89  The various methods of treatment have been discussed with the patient and family. After consideration of risks, benefits and other options for treatment, the patient has consented to  Procedure(s): POPLITEAL EMBOLECTOMY; POSSIBLE FEMORAL EMBOLECTOMY (Left) as a surgical intervention .  The patient's history has been reviewed, patient examined, no change in status, stable for surgery.  I have reviewed the patient's chart and labs.  Questions were answered to the patient's satisfaction.     Josephina GipLawson, James

## 2015-04-16 NOTE — Progress Notes (Signed)
[facility] TEAM 1 - Stepdown/ICU TEAM PROGRESS NOTE  Cassandra [name] ZOX:[id number] DOB: 22-Mar-1946 DOA: 04/12/2015 PCP: Pcp Not In System  Admit HPI / Brief Narrative: 69 y.o.F Hx HTN who presented w/ c/o L leg pain that started around 23:00 on 04/11/15. Acute onset when ambulating from chair to bed. Associated w/ numbness in the foot but denies back pain.  Associated, SOB x3 days but no CP, cough, fevers, palpitations.  In the ED a CT noted large B central and lobar PE.    HPI/Subjective: The pt is seen in the PACU.  She is alert and resting comfortably.  She denies cp, sob, n/v, or abdom pain.  She states her L foot feels warm and does not hurt.    Assessment/Plan:  Pulmonary embolism -CT noted large bilateral central and lobar PE, with right cardiac strain - D-dimer 14.52 - PESI class I -Heparin started in the ED - Dr. Joseph Art discussed pros and cons of coumadinv/s NOAC - patient chose Xarelto  -IV heparin as per Vas Surg post-op - life long anticoag given un-incited PE/DVT as well as limb threatening arterial thrombus   Acute arterial insuff L LE - arterial thrombus -care per Vasc Surgery - s/o surgical correction today   Acute kidney failure - unknown recent baseline  -crt baseline 1.0 in 2012  -discontinue ACEI/ARB -crt holding steady for now - follow trend   Hyperglycemia -185 on admission - no h/o DM - A1c 5.6 - likely simple stress reaction   Hypokalemia  -corrected   HTN -follow w/o change today   Dependent edema -Resolved  Code Status: FULL Family Communication: no family present at time of exam Disposition Plan: SDU  Consultants: none  Procedures: 12/5 CT angiogram chest PE protocol - Large bilateral central and lobar pulmonary artery emboli with findings concerning for a degree of right cardiac straining. 12/6 TTE - LVEF 55%- 60%. - Right ventricle: moderately dilated - Right atrium: mildly dilated.- Pulmonary arteries: PA peak pressure: 39 mm Hg  (S).  Antibiotics: none  DVT prophylaxis: IV heparin   Objective: Blood pressure 113/55, pulse 67, temperature 97.7 F (36.5 C), temperature source Oral, resp. rate 17, height  (1.6 m), weight 109.8 kg (242 lb 1 oz), SpO2 100 %.  Intake/Output Summary (Last 24 hours) at 04/16/15 1637 Last data filed at 04/16/15 1600  Gross per 24 hour  Intake 4305.16 ml  Output   3143 ml  Net 1162.16 ml   Exam: General: alert and pleasant  Lungs: Clear to auscultation bilaterally without wheezes/crackles Cardiovascular: Regular rate and rhythm without murmur gallop or rub  Abdomen: Nontender, nondistended, soft, bowel sounds positive, Extremities: No significant clubbing, or edema bilateral lower extremities - L foot now w/ brisk 2+ DP pulse and warm to touch   Data Reviewed: Basic Metabolic Panel:  Recent Labs Lab 04/12/15 0230 04/12/15 0928 04/14/15 0447 04/15/15 0231 04/16/15 0500  NA 133*  --  139 139 139  K 3.2*  --  4.6 4.8 4.2  CL 101  --  110 110 111  CO2 20*  --  23 22 21*  GLUCOSE 185*  --  105* 92 96  BUN 14  --  CREATININE 1.36*  --  1.13* 1.16* 1.20*  CALCIUM 8.8*  --  8.8* 8.9 8.6*  MG  --  1.5* 1.7  --   --   PHOS  --  3.3  --  2.8  --     CBC:  Recent Labs Lab 04/12/15 0230 04/13/15 0600 04/14/15 0447 04/15/15 0231 04/16/15 0500  WBC 9.9 6.1 7.5 6.8 7.3  NEUTROABS 7.6  --   --   --   --   HGB 12.4 10.2* 10.8* 10.7* 10.8*  HCT 36.1 30.6* 33.2* 32.8* 32.0*  MCV 89.6 91.1 92.0 91.9 91.4  PLT 141* 142* 187 187 216    Liver Function Tests:  Recent Labs Lab 04/14/15 0447 04/15/15 0231  AST 43*  --   ALT 25  --   ALKPHOS 67  --   BILITOT 0.4  --   PROT 6.6  --   ALBUMIN 3.0* 3.0*   Cardiac Enzymes:  Recent Labs Lab 04/12/15 0928 04/12/15 1826 04/13/15 0058 04/13/15 0600  TROPONINI 0.09* 0.07* 0.07* 0.07*    CBG:  Recent Labs Lab 04/12/15 1214 04/12/15 1644 04/12/15 2334  GLUCAP 95 137* 103*    Recent Results (from  the past 240 hour(s))  MRSA PCR Screening     Status: None   Collection Time: 04/12/15  6:39 AM  Result Value Ref Range Status   MRSA by PCR NEGATIVE NEGATIVE Final    Comment:        The GeneXpert MRSA Assay (FDA approved for NASAL specimens only), is one component of a comprehensive MRSA colonization surveillance program. It is not intended to diagnose MRSA infection nor to guide or monitor treatment for MRSA infections.      Studies:   Recent x-ray studies have been reviewed in detail by the Attending Physician  Scheduled Meds:  Scheduled Meds: . [MAR Hold] antiseptic oral rinse  7 mL Mouth Rinse BID  . [MAR Hold] cefUROXime (ZINACEF)  IV  1.5 g Intravenous Q12H  . [MAR Hold] docusate sodium  100 mg Oral Daily  . [MAR Hold] sodium chloride  3 mL Intravenous Q12H    Time spent on care of this patient: 25 mins   [name] , MD   [facility] Office  [phone] Pager - Text Page per Loretha StaplerAmion as per below:  On-Call/Text Page:      [email or web address]      password [email or web address]  If 7PM-7AM, please contact night-coverage [email or web address] Password [email or web address] 04/16/2015, 4:37 PM   LOS: 4 days

## 2015-04-16 NOTE — Op Note (Signed)
OPERATIVE REPORT  Date of Surgery: 04/12/2015 - 04/16/2015  Surgeon: Josephina GipJames Sebastiana Wuest, MD  Assistant: Doreatha MassedSamantha Rhyne PA  Pre-op Diagnosis: Peripheral arterial disease I70.92; Ischemic left lower extremity M62.89  Post-op Diagnosis: peripheral arterial disease,ischemic left lower extremity  Procedure: Procedure(s): Left LegTransPOPLITEAL EMBOLECTOMY of left iliac, left popliteal left anterior tibial, left tibio- peroneal trunk LOWER EXTREMITY ANGIOGRAM  l leg Bovine PATCH ANGIOPLASTY-left popliteal artery  Anesthesia: General  EBL: 400 cc  The patient was taken the operative room placed in supine position at which time satisfactory general endotracheal anesthesia was administered. The left lower extremity and the right inguinal area were prepped Betadine scrub and solution draped in routine sterile manner. The left popliteal artery was exposed in the below-knee position make a medial incision display below the knee carried down through subcutaneous tissue. The semimembranosus tendon was divided for exposure. The popliteal artery was exposed and was pulseless and thrombosed. It was dissected down to the origin of the anterior tibial artery which was encircled with Vesseloops. The tibioperoneal trunk was dissected down about 4-5 cm it was also pulseless. Patient was then given 6000 units of heparin intravenously and longitudinal opening made in the popliteal artery 15 blade extended with Potts scissors. There was fresh thrombus filling the vessel. A 3 Fogarty catheter was then passed down the anterior tibial artery to the ankle upon return organized thrombus retrieved followed by good backbleeding. Several additional passes down the anterior tibial artery were made with no more thrombus retrieved. This was flushed with heparin saline. Fogarty was then passed down the tibioperoneal trunk and went down the peroneal artery to the  distal calf level. There was thrombus in the tibioperoneal trunk and the  origin of the peroneal which was removed but it did not extend down the whole length of the peroneal artery. Following this a 4 Fogarty catheter was passed proximally up into the aorta and upon return the known embolus and thrombotic material from the left common iliac system was removed from this trans-popliteal approach. A long core of organized thrombus was removed followed by excellent inflow. Additional passes with a 4 Fogarty and also with a 5 Fogarty were made with no more thrombus retrieved from the proximal left leg. Following this the arthrotomy was flushed with heparin saline and a bovine pericardial patch was fashioned appropriately and sewn into place with 6-0 Prolene. Prior to completion appropriate retrograde and antegrade flushing was performed the closure completed and there was an excellent pulse and excellent Doppler flow in the tibioperoneal trunk and anterior tibial artery and also in the dorsalis pedis artery the foot level. Intraoperative arteriogram was performed which revealed the popliteal artery to be widely patent with good flow through the anterior tibial and peroneal arteries to the ankle. The posterior tibial appeared to be chronically occluded proximally possibly with an aberrant origin and did not fill from the tibioperoneal trunk. It did fill by collaterals. Protamine was not given adequate hemostasis was achieved Jackson-Pratt drain brought through a superiorly based stab wound secured with silk suture and the wound closed in layers with Vicryl subcuticular fashion with Dermabond patient taken to recovery in satisfactory condition omplications: None  Procedure Details:   Josephina GipJames Bryse Blanchette, MD 04/16/2015 9:42 AM

## 2015-04-16 NOTE — Anesthesia Procedure Notes (Signed)
Procedure Name: Intubation Date/Time: 04/16/2015 7:47 AM Performed by: Little IshikawaMERCER, Tyara Dassow L Pre-anesthesia Checklist: Patient identified, Timeout performed, Emergency Drugs available, Suction available and Patient being monitored Patient Re-evaluated:Patient Re-evaluated prior to inductionOxygen Delivery Method: Circle system utilized Preoxygenation: Pre-oxygenation with 100% oxygen Intubation Type: IV induction Ventilation: Mask ventilation without difficulty Laryngoscope Size: Mac and 3 Grade View: Grade I Tube type: Oral Tube size: 7.0 mm Number of attempts: 1 Airway Equipment and Method: Stylet Placement Confirmation: ETT inserted through vocal cords under direct vision,  positive ETCO2 and breath sounds checked- equal and bilateral Secured at: 21 cm Tube secured with: Tape Dental Injury: Teeth and Oropharynx as per pre-operative assessment

## 2015-04-16 NOTE — Progress Notes (Signed)
Pt prepped for OR.  1 gold ring placed in cup at bedside with pt. sticker attached.  Dentures also placed in cup at bedside.  Pt's cell phone and charger are on bedside table as well.

## 2015-04-16 NOTE — Anesthesia Procedure Notes (Signed)
Procedure Name: Intubation Date/Time: 04/16/2015 1:04 PM Performed by: Virgel GessHOLTZMAN, Ender Rorke LEFFEW Pre-anesthesia Checklist: Patient identified, Patient being monitored, Timeout performed, Emergency Drugs available and Suction available Patient Re-evaluated:Patient Re-evaluated prior to inductionOxygen Delivery Method: Circle System Utilized Preoxygenation: Pre-oxygenation with 100% oxygen Intubation Type: IV induction Ventilation: Mask ventilation without difficulty Laryngoscope Size: Mac and 3 Grade View: Grade I Tube type: Oral Tube size: 7.0 mm Number of attempts: 1 Airway Equipment and Method: Stylet Placement Confirmation: ETT inserted through vocal cords under direct vision,  positive ETCO2 and breath sounds checked- equal and bilateral Secured at: 22 cm Tube secured with: Tape Dental Injury: Teeth and Oropharynx as per pre-operative assessment

## 2015-04-16 NOTE — Progress Notes (Signed)
OT Cancellation Note  Patient Details Name: Cassandra [name] Clos MRN: [id number] DOB: 21-Aug-1945   Cancelled Treatment:    Reason Eval/Treat Not Completed: Patient at procedure or test/ unavailable (Pt in OR. Will continue to followl.)  Evern BioMayberry, Parthenia Tellefsen Lynn 04/16/2015, 7:56 AM

## 2015-04-16 NOTE — Anesthesia Preprocedure Evaluation (Addendum)
Anesthesia Evaluation  Patient identified by MRN, date of birth, ID band Patient awake    Reviewed: Allergy & Precautions  History of Anesthesia Complications Negative for: history of anesthetic complications  Airway Mallampati: II  TM Distance: >3 FB     Dental  (+) Poor Dentition, Missing   Pulmonary    breath sounds clear to auscultation       Cardiovascular hypertension,  Rhythm:Regular Rate:Normal  reecent pulm embolism and Rheart strain   Neuro/Psych    GI/Hepatic   Endo/Other  Morbid obesity  Renal/GU Renal InsufficiencyRenal disease     Musculoskeletal  (+) Arthritis ,   Abdominal (+) + obese,   Peds  Hematology  (+) anemia ,       Component                Value               Date/Time                 WBC                      7.3                 04/16/2015 0500           RBC                      3.50*               04/16/2015 0500           HGB                      10.8*               04/16/2015 0500           HCT                      32.0*               04/16/2015 0500           PLT                      216                 04/16/2015 0500           LABPROT                  16.9*               04/16/2015 0500           INR                      1.36                04/16/2015 0500           APTT                     132*                04/16/2015 0500             Component                Value  Date/Time                 NA                       139                 04/16/2015 0500           K                        4.2                 04/16/2015 0500           CL                       111                 04/16/2015 0500           CO2                      21*                 04/16/2015 0500           BUN                      6                   04/16/2015 0500           CREATININE               1.20*               04/16/2015 0500           CALCIUM                  8.6*                04/16/2015  0500           ALKPHOS                  67                  04/14/2015 0447           AST                      43*                 04/14/2015 0447           ALT                      25                  04/14/2015 0447           BILITOT                  0.4                 04/14/2015 0447          Anesthesia Other Findings   Reproductive/Obstetrics                           Anesthesia Physical Anesthesia Plan  ASA: III  Anesthesia Plan: General   Post-op Pain Management:    Induction: Intravenous  Airway Management Planned: Oral ETT  Additional Equipment: Arterial line  Intra-op Plan:   Post-operative Plan: Extubation in OR and Possible Post-op intubation/ventilation  Informed Consent: I have reviewed the patients History and Physical, chart, labs and discussed the procedure including the risks, benefits and alternatives for the proposed anesthesia with the patient or authorized representative who has indicated his/her understanding and acceptance.     Plan Discussed with: CRNA and Surgeon  Anesthesia Plan Comments:         Anesthesia Quick Evaluation

## 2015-04-16 NOTE — Transfer of Care (Signed)
Immediate Anesthesia Transfer of Care Note  Patient: Cassandra [name]  Procedure(s) Performed: Procedure(s): EVACUATION HEMATOMA FROM LEFT LEG POPLITEAL ARTERY EXPOSURE (Left)  Patient Location: PACU  Anesthesia Type:General  Level of Consciousness: awake, alert , patient cooperative and responds to stimulation  Airway & Oxygen Therapy: Patient Spontanous Breathing and Patient connected to nasal cannula oxygen  Post-op Assessment: Report given to RN, Post -op Vital signs reviewed and stable and Patient moving all extremities X 4  Post vital signs: Reviewed and stable  Last Vitals:  Filed Vitals:   04/16/15 1119 04/16/15 1136  BP: 151/70 136/69  Pulse: 62 58  Temp: 36.7 C 36.5 C  Resp: 16 15    Complications: No apparent anesthesia complications

## 2015-04-17 LAB — APTT: APTT: 71 s — AB (ref 24–37)

## 2015-04-17 LAB — BASIC METABOLIC PANEL
ANION GAP: 9 (ref 5–15)
BUN: 7 mg/dL (ref 6–20)
CALCIUM: 7.9 mg/dL — AB (ref 8.9–10.3)
CO2: 19 mmol/L — ABNORMAL LOW (ref 22–32)
Chloride: 110 mmol/L (ref 101–111)
Creatinine, Ser: 1.23 mg/dL — ABNORMAL HIGH (ref 0.44–1.00)
GFR, EST AFRICAN AMERICAN: 50 mL/min — AB (ref >60)
GFR, EST NON AFRICAN AMERICAN: 43 mL/min — AB (ref >60)
GLUCOSE: 90 mg/dL (ref 65–99)
POTASSIUM: 4.2 mmol/L (ref 3.5–5.1)
SODIUM: 138 mmol/L (ref 135–145)

## 2015-04-17 LAB — CBC
HCT: 28.8 % — ABNORMAL LOW (ref 36.0–46.0)
Hemoglobin: 9.6 g/dL — ABNORMAL LOW (ref 12.0–15.0)
MCH: 30.9 pg (ref 26.0–34.0)
MCHC: 33.3 g/dL (ref 30.0–36.0)
MCV: 92.6 fL (ref 78.0–100.0)
PLATELETS: 185 10*3/uL (ref 150–400)
RBC: 3.11 MIL/uL — AB (ref 3.87–5.11)
RDW: 14.2 % (ref 11.5–15.5)
WBC: 10.3 10*3/uL (ref 4.0–10.5)

## 2015-04-17 LAB — HEPARIN LEVEL (UNFRACTIONATED)
HEPARIN UNFRACTIONATED: 0.46 [IU]/mL (ref 0.30–0.70)
Heparin Unfractionated: 0.33 IU/mL (ref 0.30–0.70)

## 2015-04-17 NOTE — Progress Notes (Signed)
[facility] TEAM 1 - Stepdown/ICU TEAM PROGRESS NOTE  LUDMILLA MCGILLIS NWG:[id number] DOB: Mar 30, 1946 DOA: 04/12/2015 PCP: Pcp Not In System  Admit HPI / Brief Narrative: 69 y.o.F Hx HTN who presented w/ c/o L leg pain that started around 23:00 on 04/11/15. Acute onset when ambulating from chair to bed. Associated w/ numbness in the foot but denies back pain.  Associated, SOB x3 days but no CP, cough, fevers, palpitations.  In the ED a CT noted large B central and lobar PE.    HPI/Subjective: The patient is resting comfortably in bed.  She denies significant foot pain and thigh pain chest pain fevers chills or shortness of breath.  She is anxious to get up and get moving around.  Assessment/Plan:  Pulmonary embolism -CT noted large bilateral central and lobar PE, with right cardiac strain - D-dimer 14.52 - PESI class I -Heparin started in the ED - Dr. 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   Acute arterial insuff L LE - limb threatening arterial thrombus L LE  -care per Vasc Surgery - s/p surgical correction - appears to be healing well - exact etiology remains unclear - no PFO note at time of TTE, but this could be an intermittent issue dependent upon chamber pressure fluctuation - lifelong anticoag felt to be in her best interest at this time   Acute kidney failure - unknown recent baseline  -crt baseline 1.0 in 2012  -discontinue ACEI/ARB -crt w/o signif change today - cont to follow   Hyperglycemia -185 on admission - no h/o DM - A1c 5.6 - likely simple stress reaction   Hypokalemia  -corrected   HTN -BP well controlled   Dependent edema -Resolved  Code Status: FULL Family Communication: no family present at time of exam Disposition Plan: SDU one additional night - resume oral anticoag  tomorrow if cleared by Vasc Surg - PT/OT   Consultants: none  Procedures: 12/5 CT angiogram chest PE protocol - Large bilateral central and lobar pulmonary artery emboli with findings concerning for a degree of right cardiac straining. 12/6 TTE - LVEF 55%- 60%. - Right ventricle: moderately dilated - Right atrium: mildly dilated.- Pulmonary arteries: PA peak pressure: 39 mm Hg (S) 12/9 left leg embolectomy via a left popliteal approach, required a return visit to the operating room for bleeding  Antibiotics: none  DVT prophylaxis: IV heparin   Objective: Blood pressure 96/68, pulse 97, temperature 97.8 F (36.6 C), temperature source Oral, resp. rate 17, height  (1.6 m), weight 109.8 kg (242 lb 1 oz), SpO2 97 %.  Intake/Output Summary (Last 24 hours) at 04/17/15 1400 Last data filed at 04/17/15 1200  Gross per 24 hour  Intake   2208 ml  Output   1373 ml  Net    835 ml   Exam: General: alert and pleasant - no sob  Lungs: Clear to auscultation bilaterally Cardiovascular: Regular rate and rhythm without murmur gallop rub  Abdomen: Nontender, nondistended, soft, bowel sounds positive, Extremities: No significant clubbing, or edema bilateral lower extremities - L foot w/ brisk 2+ DP pulse and warm to touch - R LE warm to touch w/ intact pulses   Data Reviewed: Basic Metabolic Panel:  Recent Labs Lab 04/12/15 0230 04/12/15 0928 04/14/15 0447 04/15/15 0231 04/16/15 0500 04/17/15 0450  NA 133*  --  139 139 139 138  K 3.2*  --  4.6 4.8 4.2 4.2  CL 101  --  110 110 111 110  CO2 20*  --  23 22 21* 19*  GLUCOSE 185*  --  105* 92 96 90  BUN 14  --  8 7 6 7   CREATININE 1.36*  --  1.13* 1.16* 1.20* 1.23*  CALCIUM 8.8*  --  8.8* 8.9 8.6* 7.9*  MG  --  1.5* 1.7  --   --   --   PHOS  --  3.3  --  2.8  --   --     CBC:  Recent Labs Lab 04/12/15 0230 04/13/15 0600 04/14/15 0447 04/15/15 0231 04/16/15 0500 04/17/15 0450  WBC 9.9 6.1 7.5 6.8 7.3 10.3  NEUTROABS 7.6   --   --   --   --   --   HGB 12.4 10.2* 10.8* 10.7* 10.8* 9.6*  HCT 36.1 30.6* 33.2* 32.8* 32.0* 28.8*  MCV 89.6 91.1 92.0 91.9 91.4 92.6  PLT 141* 142* 187 187 216 185    Liver Function Tests:  Recent Labs Lab 04/14/15 0447 04/15/15 0231  AST 43*  --   ALT 25  --   ALKPHOS 67  --   BILITOT 0.4  --   PROT 6.6  --   ALBUMIN 3.0* 3.0*   Cardiac Enzymes:  Recent Labs Lab 04/12/15 0928 04/12/15 1826 04/13/15 0058 04/13/15 0600  TROPONINI 0.09* 0.07* 0.07* 0.07*    CBG:  Recent Labs Lab 04/12/15 1214 04/12/15 1644 04/12/15 2334  GLUCAP 95 137* 103*    Recent Results (from the past 240 hour(s))  MRSA PCR Screening     Status: None   Collection Time: 04/12/15  6:39 AM  Result Value Ref Range Status   MRSA by PCR NEGATIVE NEGATIVE Final    Comment:        The GeneXpert MRSA Assay (FDA approved for NASAL specimens only), is one component of a comprehensive MRSA colonization surveillance program. It is not intended to diagnose MRSA infection nor to guide or monitor treatment for MRSA infections.      Studies:   Recent x-ray studies have been reviewed in detail by the Attending Physician  Scheduled Meds:  Scheduled Meds: . antiseptic oral rinse  7 mL Mouth Rinse BID  . docusate sodium  100 mg Oral Daily  . sodium chloride  3 mL Intravenous Q12H    Time spent on care of this patient: 35 mins   MCCLUNG,JEFFREY T , MD   [facility] Office  [phone] Pager - Text Page per Loretha StaplerAmion as per below:  On-Call/Text Page:      [email or web address]      password [email or web address]  If 7PM-7AM, please contact night-coverage [email or web address] Password [email or web address] 04/17/2015, 2:00 PM   LOS: 5 days

## 2015-04-17 NOTE — Progress Notes (Signed)
ANTICOAGULATION CONSULT NOTE - Follow Up Consult  Pharmacy Consult for Heparin Indication: pulmonary embolus  No Known Allergies  Patient Measurements: Height: 5\' 3"  (160 cm) Weight: 242 lb 1 oz (109.8 kg) IBW/kg (Calculated) : 52.4  Vital Signs: Temp: 97.8 F (36.6 C) (12/10 1057) Temp Source: Oral (12/10 1057) BP: 96/68 mmHg (12/10 1052) Pulse Rate: 97 (12/10 1052)  Labs:  Recent Labs  04/15/15 0231  04/16/15 0500 04/16/15 1844 04/17/15 0450 04/17/15 1240  HGB 10.7*  --  10.8*  --  9.6*  --   HCT 32.8*  --  32.0*  --  28.8*  --   PLT 187  --  216  --  185  --   APTT 42*  < > 132* 30 71*  --   LABPROT  --   --  16.9*  --   --   --   INR  --   --  1.36  --   --   --   HEPARINUNFRC  --   < > 0.97*  --  0.33 0.46  CREATININE 1.16*  --  1.20*  --  1.23*  --   < > = values in this interval not displayed.  Estimated Creatinine Clearance: 50.7 mL/min (by C-G formula based on Cr of 1.23).   Medications:  Scheduled:  . antiseptic oral rinse  7 mL Mouth Rinse BID  . docusate sodium  100 mg Oral Daily  . sodium chloride  3 mL Intravenous Q12H   Infusions:  . sodium chloride 100 mL/hr at 04/17/15 1000  . heparin 800 Units/hr (04/17/15 1000)   PRN: sodium chloride, acetaminophen **OR** acetaminophen, morphine injection, ondansetron **OR** ondansetron (ZOFRAN) IV, oxyCODONE  Assessment: 69 YOM presenting with L leg pain, SOB x 3 d and was found to have unprovoked PE (life long anticoag per MD) as well as limb threatening arterial thrombus. Vasc surgery POD 1 for L LE arterial thrombus.   HL therapeutic x 2 at 0.33>0.46.  Per Vasc Surg -Ok to continue therapeutic dose heparin. No s/sx of bleeding noted. Hgb 9.6, plts 185.   Goal of Therapy:  Heparin level 0.3-0.7 units/ml Monitor platelets by anticoagulation protocol: Yes   Plan:  Cont heparin at 800 units/hr Daily HL/CBC Mon s/sx bleeding  Kelsy E Combs 04/17/2015,1:54 PM

## 2015-04-17 NOTE — Progress Notes (Signed)
ANTICOAGULATION CONSULT NOTE - Follow Up Consult  Pharmacy Consult for Heparin  Indication: pulmonary embolus  No Known Allergies  Patient Measurements: Height: 5\' 3"  (160 cm) Weight: 242 lb 1 oz (109.8 kg) IBW/kg (Calculated) : 52.4  Vital Signs: Temp: 98.4 F (36.9 C) (12/10 0328) Temp Source: Oral (12/10 0328) BP: 127/68 mmHg (12/10 0328) Pulse Rate: 85 (12/10 0328)  Labs:  Recent Labs  04/15/15 0231 04/15/15 0239  04/16/15 0500 04/16/15 1844 04/17/15 0450  HGB 10.7*  --   --  10.8*  --  9.6*  HCT 32.8*  --   --  32.0*  --  28.8*  PLT 187  --   --  216  --  185  APTT 42*  --   < > 132* 30 71*  LABPROT  --   --   --  16.9*  --   --   INR  --   --   --  1.36  --   --   HEPARINUNFRC  --  >2.20*  --  0.97*  --  0.33  CREATININE 1.16*  --   --  1.20*  --  1.23*  < > = values in this interval not displayed.  Estimated Creatinine Clearance: 50.7 mL/min (by C-G formula based on Cr of 1.23).   Assessment: Patient was started on 800 units/hr of heparin after vascular surgery procedure yesterday with plans to resume "full dose/per pharmacy dosing" this AM at 0500. Her HL is actually therapeutic this AM on 800 units/hr, so will continue at current rate.   Goal of Therapy:  Heparin level 0.3-0.7 units/ml Monitor platelets by anticoagulation protocol: Yes   Plan:  -Continue heparin at 800 units/hr -1200 HL  Carlie Corpus 04/17/2015,5:51 AM

## 2015-04-17 NOTE — Progress Notes (Signed)
    Subjective  - POD #1, status post left leg embolectomy via a left popliteal approach, required a return visit to the operating room for bleeding.  No source was found.  States her foot feels much better today   Physical Exam:  Incision healing nicely Palpable pedal pulses bilaterally Cath is soft  No significant drainage from JP after her second trip to the operating room   Assessment/Plan:  POD #1  Okay to advance heparin to therapeutic levels.  We'll continue to monitor drain output.  Out of bed.  PT consult.  Durene CalBrabham, Wells 04/17/2015 9:40 AM --  Filed Vitals:   04/17/15 0328 04/17/15 0730  BP: 127/68 125/63  Pulse: 85 88  Temp: 98.4 F (36.9 C)   Resp: 18 15    Intake/Output Summary (Last 24 hours) at 04/17/15 0940 Last data filed at 04/17/15 0810  Gross per 24 hour  Intake   2662 ml  Output   2363 ml  Net    299 ml     Laboratory CBC    Component Value Date/Time   WBC 10.3 04/17/2015 0450   HGB 9.6* 04/17/2015 0450   HCT 28.8* 04/17/2015 0450   PLT 185 04/17/2015 0450    BMET    Component Value Date/Time   NA 138 04/17/2015 0450   K 4.2 04/17/2015 0450   CL 110 04/17/2015 0450   CO2 19* 04/17/2015 0450   GLUCOSE 90 04/17/2015 0450   BUN 7 04/17/2015 0450   CREATININE 1.23* 04/17/2015 0450   CALCIUM 7.9* 04/17/2015 0450   GFRNONAA 43* 04/17/2015 0450   GFRAA 50* 04/17/2015 0450    COAG Lab Results  Component Value Date   INR 1.36 04/16/2015   No results found for: PTT  Antibiotics Anti-infectives    Start     Dose/Rate Route Frequency Ordered Stop   04/16/15 2000  cefUROXime (ZINACEF) 1.5 g in dextrose 5 % 50 mL IVPB     1.5 g 100 mL/hr over 30 Minutes Intravenous Every 12 hours 04/16/15 1015 04/17/15 0912   04/16/15 0600  cefUROXime (ZINACEF) 1.5 g in dextrose 5 % 50 mL IVPB     1.5 g 100 mL/hr over 30 Minutes Intravenous To ShortStay Surgical 04/15/15 1048 04/16/15 0748       V. Charlena CrossWells Brabham IV, M.D. [facility] [place] Office: [phone] Pager:  [phone]

## 2015-04-17 NOTE — Evaluation (Signed)
Physical Therapy Evaluation Patient Details Name: Cassandra [name] MRN: [id number] DOB: [birth year] Today's Date: 04/17/2015   History of Present Illness    69 y.o.F Hx HTN who presented w/ c/o L leg pain that started around 23:00 on 04/11/15. Acute onset when ambulating from chair to bed. Associated w/ numbness in the foot but denies back pain. Associated, SOB x3 days but no CP, cough, fevers, palpitations.  In the ED a CT noted large B central and lobar PE.   12/09: Underwent left leg embolectomy via a left popliteal approach, required a return visit to the operating room for bleeding. No source was found.  Clinical Impression  Pt presents with moderate limitations to functional mobility including pain, limited ROM, decr WB tolerance, resulting in dependency in gait, likely balance issues.  Pt has 18 steps to access home, but has very supportive family to assist, could stay with daughter(s) if stairs are barriers, and is motivated to improve.  Recommend up OOB 2-3x/day, walk short bouts with nursing assistance, and will initiate PT in acute setting to monitor progress and assist with mobility retraining.  Expect d/c home and may need HH services including PT, will reassess progress each visit and update d/c recommendation as appropriate.  See below for further details of exam findings.    Follow Up Recommendations Home health PT    Equipment Recommendations  Rolling walker with 5" wheels;3in1 (PT)    Recommendations for Other Services       Precautions / Restrictions Precautions Precautions: Fall Precaution Comments: RW in room, multiple lines/leads currently,  Restrictions Weight Bearing Restrictions: No Other Position/Activity Restrictions: elevate LEFT leg in sitting/supine      Mobility  Bed Mobility Overal bed mobility: Needs Assistance Bed Mobility: Supine to Sit     Supine to sit: Min assist;HOB elevated     General bed mobility comments: HOB 55 Degrees, initially  needs assist to lift LEFT leg to L EOB, but over time able to manage limb without physical assist; scoots to EOB and occasional instructional cue to use most effecient techniques  Transfers Overall transfer level: Needs assistance Equipment used: Rolling walker (2 wheeled) Transfers: Sit to/from Stand Sit to Stand: Min assist         General transfer comment: first time up, assist with lift off; mostly limited by apprehension, resolves over time; no impulsivity, good stabilty with RW  Ambulation/Gait Ambulation/Gait assistance: Min assist Ambulation Distance (Feet): 50 Feet Assistive device: Rolling walker (2 wheeled) Gait Pattern/deviations: Step-to pattern;Antalgic Gait velocity: slow, assess next visit Gait velocity interpretation: Below normal speed for age/gender General Gait Details: step in place prior to step off to prepare for WB on operated leg; instructed on gait sequencing using RW to limit WB to pt's pain tolerance; HR up to 140's so returned to room and up to recliner  Stairs            Wheelchair Mobility    Modified Rankin (Stroke Patients Only)       Balance Overall balance assessment: No apparent balance deficits (not formally assessed) (likey needs assess after WB tolerance improves)                                           Pertinent Vitals/Pain Pain Assessment: 0-10 Pain Score: 3  Pain Location: left leg Pain Intervention(s): Limited activity within patient's tolerance;Monitored during session;Repositioned  Home Living Family/patient expects to be discharged to:: Private residence Living Arrangements: Alone Available Help at Discharge: Family;Available 24 hours/day Type of Home: Apartment Home Access: Stairs to enter Entrance Stairs-Rails: Can reach both Entrance Stairs-Number of Steps: 18 Home Layout: One level Home Equipment: Cane - single point Additional Comments: can stay with daughters but they have steps too; was  up/down steps preoperatively so expect can recover...    Prior Function Level of Independence: Independent with assistive device(s)               Hand Dominance   Dominant Hand: Right    Extremity/Trunk Assessment   Upper Extremity Assessment: Overall WFL for tasks assessed           Lower Extremity Assessment: LLE deficits/detail   LLE Deficits / Details: postoperatve incision with JP drain still in place, ACE wrap to cover; able to lift and reposition leg with no more than minimal assist, mostly independently     Communication   Communication: No difficulties  Cognition Arousal/Alertness: Awake/alert Behavior During Therapy: WFL for tasks assessed/performed Overall Cognitive Status: Within Functional Limits for tasks assessed                      General Comments General comments (skin integrity, edema, etc.): incision on left leg not visualized, JP drain in place, ACE over;     Exercises        Assessment/Plan    PT Assessment Patient needs continued PT services  PT Diagnosis Difficulty walking;Acute pain   PT Problem List Pain;Obesity;Cardiopulmonary status limiting activity;Decreased knowledge of use of DME;Decreased mobility;Decreased activity tolerance;Decreased range of motion;Decreased strength  PT Treatment Interventions DME instruction;Gait training;Stair training;Functional mobility training;Therapeutic exercise;Therapeutic activities;Patient/family education   PT Goals (Current goals can be found in the Care Plan section) Acute Rehab PT Goals Patient Stated Goal: do for myself PT Goal Formulation: With patient Time For Goal Achievement: 05/01/15 Potential to Achieve Goals: Good    Frequency Min 3X/week   Barriers to discharge Inaccessible home environment (continually reassess as pt progresses) 18 steps to access home; has LOTS of helpful family    Co-evaluation               End of Session Equipment Utilized During  Treatment: Gait belt Activity Tolerance: Patient tolerated treatment well;Treatment limited secondary to medical complications (Comment) (incr HR, base 120's, up to 140's with gait (nerves??)) Patient left: in chair;with call bell/phone within reach Nurse Communication: Mobility status         Time: [id number] PT Time Calculation (min) (ACUTE ONLY): 31 min   Charges:   PT Evaluation $Initial PT Evaluation Tier I: 1 Procedure PT Treatments $Gait Training: 8-22 mins   PT G Codes:        Dennis BastMartin, Jasyn Mey Galloway 04/17/2015, 9:57 AM

## 2015-04-18 LAB — RENAL FUNCTION PANEL
ANION GAP: 7 (ref 5–15)
Albumin: 2.7 g/dL — ABNORMAL LOW (ref 3.5–5.0)
BUN: 8 mg/dL (ref 6–20)
CALCIUM: 8.1 mg/dL — AB (ref 8.9–10.3)
CO2: 23 mmol/L (ref 22–32)
Chloride: 111 mmol/L (ref 101–111)
Creatinine, Ser: 1.23 mg/dL — ABNORMAL HIGH (ref 0.44–1.00)
GFR calc Af Amer: 50 mL/min — ABNORMAL LOW (ref >60)
GFR calc non Af Amer: 43 mL/min — ABNORMAL LOW (ref >60)
GLUCOSE: 112 mg/dL — AB (ref 65–99)
Phosphorus: 2.7 mg/dL (ref 2.5–4.6)
Potassium: 4.1 mmol/L (ref 3.5–5.1)
SODIUM: 141 mmol/L (ref 135–145)

## 2015-04-18 LAB — CBC
HCT: 28.2 % — ABNORMAL LOW (ref 36.0–46.0)
HEMOGLOBIN: 9.4 g/dL — AB (ref 12.0–15.0)
MCH: 30.9 pg (ref 26.0–34.0)
MCHC: 33.3 g/dL (ref 30.0–36.0)
MCV: 92.8 fL (ref 78.0–100.0)
Platelets: 211 10*3/uL (ref 150–400)
RBC: 3.04 MIL/uL — ABNORMAL LOW (ref 3.87–5.11)
RDW: 14.4 % (ref 11.5–15.5)
WBC: 8.5 10*3/uL (ref 4.0–10.5)

## 2015-04-18 LAB — HEPARIN LEVEL (UNFRACTIONATED): HEPARIN UNFRACTIONATED: 0.35 [IU]/mL (ref 0.30–0.70)

## 2015-04-18 MED ORDER — RIVAROXABAN 15 MG PO TABS
15.0000 mg | ORAL_TABLET | Freq: Two times a day (BID) | ORAL | Status: DC
  Administered 2015-04-18 – 2015-04-20 (×4): 15 mg via ORAL
  Filled 2015-04-18 (×4): qty 1

## 2015-04-18 MED ORDER — RIVAROXABAN 20 MG PO TABS: 20.0000 mg | ORAL_TABLET | Freq: Every day | ORAL | Status: DC

## 2015-04-18 NOTE — Progress Notes (Signed)
    Subjective  - POD #2, s/p left leg thrombectomy  Walked to nurses station ysterday but had to stop due to tachycardia Pain tolerable   Physical Exam:  Palpable left DP Incision ok  Drain 50cc       Assessment/Plan:  POD #2  Continue with mobilization Hb stable Keep drain 1 more day Full anticoagulation, OK to start PO anticoagulation  Brabham, Wells 04/18/2015 8:09 AM --  Filed Vitals:   04/18/15 0000 04/18/15 0341  BP: 126/52 128/83  Pulse: 97 94  Temp: 98.2 F (36.8 C) 97.7 F (36.5 C)  Resp: 19 10    Intake/Output Summary (Last 24 hours) at 04/18/15 0809 Last data filed at 04/18/15 0341  Gross per 24 hour  Intake 1854.73 ml  Output   2075 ml  Net -220.27 ml     Laboratory CBC    Component Value Date/Time   WBC 8.5 04/18/2015 0343   HGB 9.4* 04/18/2015 0343   HCT 28.2* 04/18/2015 0343   PLT 211 04/18/2015 0343    BMET    Component Value Date/Time   NA 141 04/18/2015 0343   K 4.1 04/18/2015 0343   CL 111 04/18/2015 0343   CO2 23 04/18/2015 0343   GLUCOSE 112* 04/18/2015 0343   BUN 8 04/18/2015 0343   CREATININE 1.23* 04/18/2015 0343   CALCIUM 8.1* 04/18/2015 0343   GFRNONAA 43* 04/18/2015 0343   GFRAA 50* 04/18/2015 0343    COAG Lab Results  Component Value Date   INR 1.36 04/16/2015   No results found for: PTT  Antibiotics Anti-infectives    Start     Dose/Rate Route Frequency Ordered Stop   04/16/15 2000  cefUROXime (ZINACEF) 1.5 g in dextrose 5 % 50 mL IVPB     1.5 g 100 mL/hr over 30 Minutes Intravenous Every 12 hours 04/16/15 1015 04/17/15 0912   04/16/15 0600  cefUROXime (ZINACEF) 1.5 g in dextrose 5 % 50 mL IVPB     1.5 g 100 mL/hr over 30 Minutes Intravenous To ShortStay Surgical 04/15/15 1048 04/16/15 0748       V. Charlena CrossWells Brabham IV, M.D. [facility] [place] Office: [phone] Pager:  [phone]

## 2015-04-18 NOTE — Progress Notes (Signed)
Pt transferred to 2 west room 12 on telemetry and placed on telemetry by receiving RN. Marisue Ivanobyn Nickia Boesen RN

## 2015-04-18 NOTE — Care Management Note (Addendum)
Case Management Note  Patient Details  Name: Cassandra [name] MRN: [id number] Date of Birth: 1945-12-23  Subjective/Objective:                 Admitted with  L leg pain/SOB,  In the ED a CT noted large B central and lobar PE.12/09: Underwent left leg embolectomy.    Action/Plan: Return to home with hh services. CM to f/u with d/c needs.  Expected Discharge Date:               Expected Discharge Plan:  Home w Home Health Services  In-House Referral:     Discharge planning Services  CM Consult  Post Acute Care Choice:    Choice offered to:   Patient  DME Arranged:   rolling walker , 3 in 1 DME Agency:   [facility]  HH Arranged:   PT HH Agency:   [facility]  Status of Service:  In process, will continue to follow  Medicare Important Message Given:    Date Medicare IM Given:    Medicare IM give by:    Date Additional Medicare IM Given:    Additional Medicare Important Message give by:     If discussed at Long Length of Stay Meetings, dates discussed:    Additional Comments: CM spoke with pt regarding d/c planning. CM shared with pt  PT's evaluation/recommendation: HHPT. Pt chosed [facility] to provide HHPT services.   Verlon SettingKimberly [name] (Daughter)  [phone]  Gae GallopCole, Myreon Wimer [place], [place]RN,BSN,CM [phone] 04/18/2015, 12:47 PM

## 2015-04-18 NOTE — Progress Notes (Addendum)
ANTICOAGULATION CONSULT NOTE - Follow Up Consult  Pharmacy Consult for Heparin -> Xarelto  Indication: pulmonary embolus  No Known Allergies  Patient Measurements: Height: 5\' 3"  (160 cm) Weight: 242 lb 1 oz (109.8 kg) IBW/kg (Calculated) : 52.4  Vital Signs: Temp: 98.5 F (36.9 C) (12/11 0815) Temp Source: Oral (12/11 0815) BP: 126/81 mmHg (12/11 0815) Pulse Rate: 100 (12/11 0815)  Labs:  Recent Labs  04/16/15 0500 04/16/15 1844 04/17/15 0450 04/17/15 1240 04/18/15 0343  HGB 10.8*  --  9.6*  --  9.4*  HCT 32.0*  --  28.8*  --  28.2*  PLT 216  --  185  --  211  APTT 132* 30 71*  --   --   LABPROT 16.9*  --   --   --   --   INR 1.36  --   --   --   --   HEPARINUNFRC 0.97*  --  0.33 0.46 0.35  CREATININE 1.20*  --  1.23*  --  1.23*    Estimated Creatinine Clearance: 50.7 mL/min (by C-G formula based on Cr of 1.23).   Medications:  Scheduled:  . antiseptic oral rinse  7 mL Mouth Rinse BID  . docusate sodium  100 mg Oral Daily  . sodium chloride  3 mL Intravenous Q12H   Infusions:  . sodium chloride 50 mL/hr at 04/18/15 [id number]  . heparin 800 Units/hr (04/18/15 0828)   PRN: acetaminophen **OR** acetaminophen, morphine injection, ondansetron **OR** ondansetron (ZOFRAN) IV, oxyCODONE  Assessment: 69 YOM presenting with L leg pain, SOB x 3 d and was found to have unprovoked PE (life long anticoag per MD) as well as limb threatening arterial thrombus. Vasc surgery POD 2 for L LE arterial thrombus.  Hgb stable 9.4, Plts stable 211, HL therapeutic at 0.35, no s/sx bleeding noted.    MD consults to transition to xarelto.  CrCl 50.7.    Goal of Therapy:  Heparin level 0.3-0.7 units/ml Monitor platelets by anticoagulation protocol: Yes   Plan:  Xarelto 15 mg BID w/ food x 21 days then 20 mg daily w/ food Mon s/sx bleeding, changes in renal fxn    04/18/2015,10:21 AM Hazle NordmannKelsy Eden Toohey, PharmD Pharmacy Resident [phone]

## 2015-04-18 NOTE — Progress Notes (Signed)
[place] TEAM 1 - Stepdown/ICU TEAM PROGRESS NOTE  Cassandra [name] ZOX:[id number] DOB: 04-08-1946 DOA: 04/12/2015 PCP: Pcp Not In System  Admit HPI / Brief Narrative: 69 y.o.F Hx HTN who presented w/ c/o L leg pain that started around 23:00 on 04/11/15. Acute onset when ambulating from chair to bed. Associated w/ numbness in the foot but denies back pain.  Associated, SOB x3 days but no CP, cough, fevers, palpitations.  In the ED a CT noted large B central and lobar PE.    HPI/Subjective: The pt has no new complaints today.  She ambulated yesterday, but her exertion was limited by tachycardia.  She denies cp, n/v, abdom pain, or leg pain.    Assessment/Plan:  Pulmonary embolism -CT noted large bilateral central and lobar PE, with right cardiac strain - D-dimer 14.52 - PESI class I -Heparin started in the ED - Dr. 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  -to transition back to Xarelto today   Acute arterial insuff L LE - limb threatening arterial thrombus L LE  -care per [facility] Surgery - s/p surgical correction - appears to be healing well - exact etiology remains unclear - no PFO note at time of TTE, but this could be an intermittent issue dependent upon chamber pressure fluctuation - lifelong anticoag felt to be in her best interest at this time - I discussed this w/ her at length today and she voiced understanding   Acute kidney failure - unknown recent baseline  -crt baseline 1.0 in 2012  -discontinue ACEI/ARB -crt holding stable at ~1.2  Hyperglycemia -185 on admission - no h/o DM - A1c 5.6 - likely simple stress reaction   Hypokalemia  -corrected   HTN -BP well controlled   Dependent edema -Resolved  Code Status: FULL Family Communication: no family present at time of  exam Disposition Plan: transfer to med bed - transition to oral anticoag - wound care per [facility] - possible d/c home next 48hrs   Consultants: none  Procedures: 12/5 CT angiogram chest PE protocol - Large bilateral central and lobar pulmonary artery emboli with findings concerning for a degree of right cardiac straining. 12/6 TTE - LVEF 55%- 60%. - Right ventricle: moderately dilated - Right atrium: mildly dilated.- Pulmonary arteries: PA peak pressure: 39 mm Hg (S) 12/9 left leg embolectomy via a left popliteal approach, required a return visit to the operating room for bleeding  Antibiotics: none  DVT prophylaxis: IV heparin > Xarelto   Objective: Blood pressure 126/81, pulse 100, temperature 98.5 F (36.9 C), temperature source Oral, resp. rate 22, height  (1.6 m), weight 109.8 kg (242 lb 1 oz), SpO2 96 %.  Intake/Output Summary (Last 24 hours) at 04/18/15 1155 Last data filed at 04/18/15 8119  Gross per 24 hour  Intake 1765.79 ml  Output   2285 ml  Net -519.21 ml   Exam: General: alert and pleasant - conversant - no acute distress  Lungs: Clear to auscultation bilaterally w/o crackles  Cardiovascular: Regular rate and rhythm without murmur Abdomen: Nontender, nondistended, soft, bowel sounds positive, Extremities: No significant clubbing, or edema bilateral lower extremities - L foot w/ brisk 2+ DP pulse and warm to touch - R LE warm to touch w/ intact pulses   Data Reviewed: Basic Metabolic Panel:  Recent Labs  Lab 04/12/15 0928 04/14/15 0447 04/15/15 0231 04/16/15 0500 04/17/15 0450 04/18/15 0343  NA  --  139 139 139 138 141  K  --  4.6 4.8 4.2 4.2 4.1  CL  --  110 110 111 110 111  CO2  --  23 22 21* 19* 23  GLUCOSE  --  105* 92 96 90 112*  BUN  --  8 7 6 7 8   CREATININE  --  1.13* 1.16* 1.20* 1.23* 1.23*  CALCIUM  --  8.8* 8.9 8.6* 7.9* 8.1*  MG 1.5* 1.7  --   --   --   --   PHOS 3.3  --  2.8  --   --  2.7    CBC:  Recent Labs Lab 04/12/15 0230   04/14/15 0447 04/15/15 0231 04/16/15 0500 04/17/15 0450 04/18/15 0343  WBC 9.9  < > 7.5 6.8 7.3 10.3 8.5  NEUTROABS 7.6  --   --   --   --   --   --   HGB 12.4  < > 10.8* 10.7* 10.8* 9.6* 9.4*  HCT 36.1  < > 33.2* 32.8* 32.0* 28.8* 28.2*  MCV 89.6  < > 92.0 91.9 91.4 92.6 92.8  PLT 141*  < > 187 187 216 185 211  < > = values in this interval not displayed.  Liver Function Tests:  Recent Labs Lab 04/14/15 0447 04/15/15 0231 04/18/15 0343  AST 43*  --   --   ALT 25  --   --   ALKPHOS 67  --   --   BILITOT 0.4  --   --   PROT 6.6  --   --   ALBUMIN 3.0* 3.0* 2.7*   Cardiac Enzymes:  Recent Labs Lab 04/12/15 0928 04/12/15 1826 04/13/15 0058 04/13/15 0600  TROPONINI 0.09* 0.07* 0.07* 0.07*    CBG:  Recent Labs Lab 04/12/15 1214 04/12/15 1644 04/12/15 2334  GLUCAP 95 137* 103*    Recent Results (from the past 240 hour(s))  MRSA PCR Screening     Status: None   Collection Time: 04/12/15  6:39 AM  Result Value Ref Range Status   MRSA by PCR NEGATIVE NEGATIVE Final    Comment:        The GeneXpert MRSA Assay (FDA approved for NASAL specimens only), is one component of a comprehensive MRSA colonization surveillance program. It is not intended to diagnose MRSA infection nor to guide or monitor treatment for MRSA infections.      Studies:   Recent x-ray studies have been reviewed in detail by the Attending Physician  Scheduled Meds:  Scheduled Meds: . antiseptic oral rinse  7 mL Mouth Rinse BID  . docusate sodium  100 mg Oral Daily  . sodium chloride  3 mL Intravenous Q12H    Time spent on care of this patient: 35 mins   MCCLUNG,JEFFREY T , MD   [facility] Office  [phone] Pager - Text Page per Loretha StaplerAmion as per below:  On-Call/Text Page:      [email or web address]      password [email or web address]  If 7PM-7AM, please contact night-coverage [email or web address] Password [email or web address] 04/18/2015, 11:55 AM   LOS: 6 days

## 2015-04-18 NOTE — Progress Notes (Signed)
Called report to [facility], will give pt xarelto and and turn off heparin gtt. PhD, Adelina MingsKelsey, at bedside educating pt on xarelto. Marisue Ivanobyn Shen[place]ah Yeats RN

## 2015-04-18 NOTE — Discharge Instructions (Signed)
Rivaroxaban oral tablets What is this medicine? RIVAROXABAN (ri va ROX a ban) is an anticoagulant (blood thinner). It is used to treat blood clots in the lungs or in the veins. It is also used after knee or hip surgeries to prevent blood clots. It is also used to lower the chance of stroke in people with a medical condition called atrial fibrillation. This medicine may be used for other purposes; ask your health care provider or pharmacist if you have questions. What should I tell my health care provider before I take this medicine? They need to know if you have any of these conditions: -bleeding disorders -bleeding in the brain -blood in your stools (black or tarry stools) or if you have blood in your vomit -history of stomach bleeding -kidney disease -liver disease -low blood counts, like low white cell, platelet, or red cell counts -recent or planned spinal or epidural procedure -take medicines that treat or prevent blood clots -an unusual or allergic reaction to rivaroxaban, other medicines, foods, dyes, or preservatives -pregnant or trying to get pregnant -breast-feeding How should I use this medicine? Take this medicine by mouth with a glass of water. Follow the directions on the prescription label. Take your medicine at regular intervals. Do not take it more often than directed. Do not stop taking except on your doctor's advice. Stopping this medicine may increase your risk of a blood clot. Be sure to refill your prescription before you run out of medicine. If you are taking this medicine after hip or knee replacement surgery, take it with or without food. If you are taking this medicine for atrial fibrillation, take it with your evening meal. If you are taking this medicine to treat blood clots, take it with food at the same time each day. If you are unable to swallow your tablet, you may crush the tablet and mix it in applesauce. Then, immediately eat the applesauce. You should eat more  food right after you eat the applesauce containing the crushed tablet. Talk to your pediatrician regarding the use of this medicine in children. Special care may be needed. Overdosage: If you think you have taken too much of this medicine contact a poison control center or emergency room at once. NOTE: This medicine is only for you. Do not share this medicine with others. What if I miss a dose? If you take your medicine once a day and miss a dose, take the missed dose as soon as you remember. If you take your medicine twice a day and miss a dose, take the missed dose immediately. In this instance, 2 tablets may be taken at the same time. The next day you should take 1 tablet twice a day as directed. What may interact with this medicine? -aspirin and aspirin-like medicines -certain antibiotics like erythromycin, azithromycin, and clarithromycin -certain medicines for fungal infections like ketoconazole and itraconazole -certain medicines for irregular heart beat like amiodarone, quinidine, dronedarone -certain medicines for seizures like carbamazepine, phenytoin -certain medicines that treat or prevent blood clots like warfarin, enoxaparin, and dalteparin -conivaptan -diltiazem -felodipine -indinavir -lopinavir; ritonavir -NSAIDS, medicines for pain and inflammation, like ibuprofen or naproxen -ranolazine -rifampin -ritonavir -St. John's wort -verapamil This list may not describe all possible interactions. Give your health care provider a list of all the medicines, herbs, non-prescription drugs, or dietary supplements you use. Also tell them if you smoke, drink alcohol, or use illegal drugs. Some items may interact with your medicine. What should I watch for while using this   medicine? Visit your doctor or health care professional for regular checks on your progress. Your condition will be monitored carefully while you are receiving this medicine. Notify your doctor or health care  professional and seek emergency treatment if you develop breathing problems; changes in vision; chest pain; severe, sudden headache; pain, swelling, warmth in the leg; trouble speaking; sudden numbness or weakness of the face, arm, or leg. These can be signs that your condition has gotten worse. If you are going to have surgery, tell your doctor or health care professional that you are taking this medicine. Tell your health care professional that you use this medicine before you have a spinal or epidural procedure. Sometimes people who take this medicine have bleeding problems around the spine when they have a spinal or epidural procedure. This bleeding is very rare. If you have a spinal or epidural procedure while on this medicine, call your health care professional immediately if you have back pain, numbness or tingling (especially in your legs and feet), muscle weakness, paralysis, or loss of bladder or bowel control. Avoid sports and activities that might cause injury while you are using this medicine. Severe falls or injuries can cause unseen bleeding. Be careful when using sharp tools or knives. Consider using an electric razor. Take special care brushing or flossing your teeth. Report any injuries, bruising, or red spots on the skin to your doctor or health care professional. What side effects may I notice from receiving this medicine? Side effects that you should report to your doctor or health care professional as soon as possible: -allergic reactions like skin rash, itching or hives, swelling of the face, lips, or tongue -back pain -redness, blistering, peeling or loosening of the skin, including inside the mouth -signs and symptoms of bleeding such as bloody or black, tarry stools; red or dark-brown urine; spitting up blood or brown material that looks like coffee grounds; red spots on the skin; unusual bruising or bleeding from the eye, gums, or nose Side effects that usually do not require  medical attention (Report these to your doctor or health care professional if they continue or are bothersome.): -dizziness -muscle pain This list may not describe all possible side effects. Call your doctor for medical advice about side effects. You may report side effects to FDA at 1-800-FDA-1088. Where should I keep my medicine? Keep out of the reach of children. Store at room temperature between 15 and 30 degrees C (59 and 86 degrees F). Throw away any unused medicine after the expiration date. NOTE: This sheet is a summary. It may not cover all possible information. If you have questions about this medicine, talk to your doctor, pharmacist, or health care provider.    2016, Elsevier/Gold Standard. (2014-04-22 12:45:34)  

## 2015-04-19 ENCOUNTER — Ambulatory Visit (HOSPITAL_COMMUNITY): Payer: Medicare Other

## 2015-04-19 ENCOUNTER — Encounter (HOSPITAL_COMMUNITY): Payer: Self-pay | Admitting: Vascular Surgery

## 2015-04-19 DIAGNOSIS — I749 Embolism and thrombosis of unspecified artery: Secondary | ICD-10-CM

## 2015-04-19 DIAGNOSIS — D62 Acute posthemorrhagic anemia: Secondary | ICD-10-CM

## 2015-04-19 LAB — CBC
HEMATOCRIT: 27.5 % — AB (ref 36.0–46.0)
Hemoglobin: 8.9 g/dL — ABNORMAL LOW (ref 12.0–15.0)
MCH: 30.1 pg (ref 26.0–34.0)
MCHC: 32.4 g/dL (ref 30.0–36.0)
MCV: 92.9 fL (ref 78.0–100.0)
PLATELETS: 210 10*3/uL (ref 150–400)
RBC: 2.96 MIL/uL — ABNORMAL LOW (ref 3.87–5.11)
RDW: 14.3 % (ref 11.5–15.5)
WBC: 6.8 10*3/uL (ref 4.0–10.5)

## 2015-04-19 LAB — COMPREHENSIVE METABOLIC PANEL WITH GFR
ALT: 18 U/L (ref 14–54)
AST: 32 U/L (ref 15–41)
Albumin: 2.7 g/dL — ABNORMAL LOW (ref 3.5–5.0)
Alkaline Phosphatase: 56 U/L (ref 38–126)
Anion gap: 7 (ref 5–15)
BUN: 7 mg/dL (ref 6–20)
CO2: 23 mmol/L (ref 22–32)
Calcium: 8.4 mg/dL — ABNORMAL LOW (ref 8.9–10.3)
Chloride: 109 mmol/L (ref 101–111)
Creatinine, Ser: 1.11 mg/dL — ABNORMAL HIGH (ref 0.44–1.00)
GFR calc Af Amer: 57 mL/min — ABNORMAL LOW
GFR calc non Af Amer: 49 mL/min — ABNORMAL LOW
Glucose, Bld: 98 mg/dL (ref 65–99)
Potassium: 3.7 mmol/L (ref 3.5–5.1)
Sodium: 139 mmol/L (ref 135–145)
Total Bilirubin: 0.6 mg/dL (ref 0.3–1.2)
Total Protein: 5.8 g/dL — ABNORMAL LOW (ref 6.5–8.1)

## 2015-04-19 LAB — POCT I-STAT 7, (LYTES, BLD GAS, ICA,H+H)
Acid-base deficit: 1 mmol/L (ref 0.0–2.0)
Bicarbonate: 23.7 meq/L (ref 20.0–24.0)
Calcium, Ion: 1.13 mmol/L (ref 1.13–1.30)
HCT: 29 % — ABNORMAL LOW (ref 36.0–46.0)
Hemoglobin: 9.9 g/dL — ABNORMAL LOW (ref 12.0–15.0)
O2 Saturation: 100 %
Potassium: 4.6 mmol/L (ref 3.5–5.1)
Sodium: 140 mmol/L (ref 135–145)
TCO2: 25 mmol/L (ref 0–100)
pCO2 arterial: 38.6 mmHg (ref 35.0–45.0)
pH, Arterial: 7.396 (ref 7.350–7.450)
pO2, Arterial: 418 mmHg — ABNORMAL HIGH (ref 80.0–100.0)

## 2015-04-19 LAB — POCT I-STAT 4, (NA,K, GLUC, HGB,HCT)
Glucose, Bld: 141 mg/dL — ABNORMAL HIGH (ref 65–99)
HCT: 33 % — ABNORMAL LOW (ref 36.0–46.0)
Hemoglobin: 11.2 g/dL — ABNORMAL LOW (ref 12.0–15.0)
Potassium: 6.5 mmol/L (ref 3.5–5.1)
Sodium: 139 mmol/L (ref 135–145)

## 2015-04-19 NOTE — Progress Notes (Addendum)
[facility]  Subjective  - Walking more, sensation is better in her left foot.   Objective 129/52 90 98.2 F (36.8 C) (Oral) 16 96%  Intake/Output Summary (Last 24 hours) at 04/19/15 16100728 Last data filed at 04/19/15 [id number]  Gross per 24 hour  Intake   1694 ml  Output   1910 ml  Net   -216 ml    Palpable left DP Drain intact with 35 cc output last 24 hours Lungs non labored breathing  Assessment/Planning: POD # 3 status post left leg embolectomy via a left popliteal approach  D/C drain F/U with Dr. Hart RochesterLawson 3 weeks  Cassandra EdisCOLLINS, [facility] Cardiac CenterEMMA [facility] HospitalMAUREEN 04/19/2015 7:28 AM --  Laboratory Lab Results:  Recent Labs  04/18/15 0343 04/19/15 0253  WBC 8.5 6.8  HGB 9.4* 8.9*  HCT 28.2* 27.5*  PLT 211 210   BMET  Recent Labs  04/18/15 0343 04/19/15 0253  NA 141 139  K 4.1 3.7  CL 111 109  CO2 23 23  GLUCOSE 112* 98  BUN 8 7  CREATININE 1.23* 1.11*  CALCIUM 8.1* 8.4*    COAG Lab Results  Component Value Date   INR 1.36 04/16/2015   No results found for: PTT agree with above assessment Left leg incision looks good and 1+ edema below-knee on left 3+ dorsalis pedis pulse palpable left foot Plan DC drain today and continue ambulation DC home whenever Dr. Sharon SellerMcClung feels appropriate  I will see patient back in 3 weeks Would recommend lifelong anticoagulation if she tolerates it because of PE, DVT, and embolus left leg.

## 2015-04-19 NOTE — Progress Notes (Signed)
Physical Therapy Treatment Patient Details Name: Cassandra [name] MRN: [id number] DOB: 1945-05-11 Today's Date: 04/19/2015    History of Present Illness 69 y.o.F Hx HTN who presented w/ c/o L leg pain that started around 23:00 on 04/11/15. Acute onset when ambulating from chair to bed. Associated w/ numbness in the foot but denies back pain. Associated, SOB x3 days but no CP, cough, fevers, palpitations. In the ED a CT noted large B central and lobar PE.     PT Comments    Progressing well, EHR still tachy, but not to the extent of the last session  Follow Up Recommendations  Home health PT     Equipment Recommendations  Rolling walker with 5" wheels    Recommendations for Other Services       Precautions / Restrictions Precautions Precautions: Fall Precaution Comments: watch HR Restrictions Weight Bearing Restrictions: No Other Position/Activity Restrictions: elevate LEFT leg in sitting/supine    Mobility  Bed Mobility Overal bed mobility: Needs Assistance Bed Mobility: Supine to Sit;Sit to Supine     Supine to sit: Modified independent (Device/Increase time) Sit to supine:  (see comment below)   General bed mobility comments: Pt asked OT to lower HOB so [facility]   Transfers Overall transfer level: Needs assistance Equipment used: Rolling walker (2 wheeled) Transfers: Sit to/from Stand Sit to Stand: Supervision         General transfer comment: cue for hand placement  Ambulation/Gait Ambulation/Gait assistance: Supervision Ambulation Distance (Feet): 250 Feet Assistive device: Rolling walker (2 wheeled) Gait Pattern/deviations: Step-through pattern Gait velocity: slower       Stairs            Wheelchair Mobility    Modified Rankin (Stroke Patients Only)       Balance Overall balance assessment: Needs assistance Sitting-balance support: No upper extremity supported Sitting balance-Leahy Scale: Fair     Standing balance  support: No upper extremity supported Standing balance-Leahy Scale: Fair                      Cognition Arousal/Alertness: Awake/alert Behavior During Therapy: WFL for tasks assessed/performed Overall Cognitive Status: Within Functional Limits for tasks assessed                      Exercises General Exercises - Lower Extremity Hip ABduction/ADduction: AROM;Strengthening;10 reps;Both;Standing Hip Flexion/Marching: AROM;Both;10 reps;Standing Mini-Sqauts: AROM;10 reps;Standing Other Exercises Other Exercises: modified pushups against sink    General Comments General comments (skin integrity, edema, etc.): During ambulation on RA  SpO2 98% and EHR on multiple occasions 115-128 bpm.      Pertinent Vitals/Pain Pain Assessment: 0-10 Pain Score: 5  Pain Location: L LE Pain Descriptors / Indicators: Discomfort Pain Intervention(s): Monitored during session;Repositioned    Home Living Family/patient expects to be discharged to:: Private residence Living Arrangements: Alone Available Help at Discharge: Family Type of Home: Apartment Home Access: Stairs to enter Entrance Stairs-Rails: Can reach both Home Layout: One level Home Equipment: Cane - single point Additional Comments: per PT eval, can stay with daughters but they have steps too; was up/down steps preoperatively so expect can recover...    Prior Function Level of Independence: Independent with assistive device(s)          PT Goals (current goals can now be found in the care plan section) Acute Rehab PT Goals Patient Stated Goal: not stated  PT Goal Formulation: With patient Time For Goal Achievement: 05/01/15  Potential to Achieve Goals: Good Progress towards PT goals: Progressing toward goals    Frequency  Min 3X/week    PT Plan Current plan remains appropriate    Co-evaluation             End of Session   Activity Tolerance: Patient tolerated treatment well;Treatment limited secondary  to medical complications (Comment) Patient left: in bed;with call bell/phone within reach     Time: 1419-1447 PT Time Calculation (min) (ACUTE ONLY): 28 min  Charges:  $Gait Training: 8-22 mins $Therapeutic Activity: 8-22 mins                    G Codes:      Rachel Samples, Eliseo Gum 04/19/2015, 2:55 PM 04/19/2015   Bing, PT [phone]  (pager)

## 2015-04-19 NOTE — Anesthesia Postprocedure Evaluation (Signed)
Anesthesia Post Note  Patient: Cassandra [name]  Procedure(s) Performed: Procedure(s) (LRB): EVACUATION HEMATOMA FROM LEFT LEG POPLITEAL ARTERY EXPOSURE (Left)  Patient location during evaluation: PACU Anesthesia Type: General Level of consciousness: awake and alert Pain management: pain level controlled Vital Signs Assessment: post-procedure vital signs reviewed and stable Respiratory status: spontaneous breathing Cardiovascular status: blood pressure returned to baseline Anesthetic complications: no    Last Vitals:  Filed Vitals:   04/18/15 2032 04/19/15 0459  BP: 130/66 129/52  Pulse: 83 90  Temp: 36.9 C 36.8 C  Resp: 18 16    Last Pain:  Filed Vitals:   04/19/15 0500  PainSc: Asleep                 Kennieth RadFitzgerald, Lexy Meininger E

## 2015-04-19 NOTE — Evaluation (Signed)
Occupational Therapy Evaluation Patient Details Name: Dolphus JennyHelen H Lichtenberg MRN: [id number] DOB: 1946/02/04 Today's Date: 04/19/2015    History of Present Illness     Clinical Impression   Pt admitted with above. Pt independent with ADLs, PTA. Feel pt will benefit from acute OT to increase independence prior to d/c. Recommending HHOT at this time. HR up to 118 in session.    Follow Up Recommendations  Home health OT;Supervision - Intermittent    Equipment Recommendations  3 in 1 bedside comode    Recommendations for Other Services       Precautions / Restrictions Precautions Precautions: Fall Precaution Comments: watch HR Restrictions Weight Bearing Restrictions: No      Mobility Bed Mobility Overal bed mobility: Needs Assistance Bed Mobility: Supine to Sit;Sit to Supine     Supine to sit: Modified independent (Device/Increase time) Sit to supine:  (see comment below)   General bed mobility comments: Pt asked OT to lower HOB so [facility]   Transfers Overall transfer level: Needs assistance Equipment used: Rolling walker (2 wheeled) Transfers: Sit to/from Stand Sit to Stand: Min guard         General transfer comment: cue for hand placement    Balance      No LOB in session. Balance not formally assessed.                                      ADL Overall ADL's : Needs assistance/impaired                     Lower Body Dressing: Minimal assistance;Sit to/from stand   Toilet Transfer: Min guard;Ambulation;RW (sit to stand from bed)           Functional mobility during ADLs: Min guard;Rolling walker General ADL Comments: Educated on safety such as sitting for LB ADLs.  Discussed 3 in 1 as well as AE. Educated on LB dressing technique. Recommended pt elevate LLE and OT elevated it on pillows.     Vision     Perception     Praxis      Pertinent Vitals/Pain Pain Assessment: 0-10 Pain Score: 4  Pain Location:  LLE Pain Intervention(s): Repositioned;Monitored during session   HR up to 118 in session.     Hand Dominance Right   Extremity/Trunk Assessment Upper Extremity Assessment Upper Extremity Assessment: Overall WFL for tasks assessed   Lower Extremity Assessment Lower Extremity Assessment: Defer to PT evaluation       Communication Communication Communication: No difficulties   Cognition Arousal/Alertness: Awake/alert Behavior During Therapy: WFL for tasks assessed/performed Overall Cognitive Status: Within Functional Limits for tasks assessed                     General Comments       Exercises Exercises: Other exercises Other Exercises Other Exercises: instructed her to keep doing ankle pumps   Shoulder Instructions      Home Living Family/patient expects to be discharged to:: Private residence Living Arrangements: Alone Available Help at Discharge: Family Type of Home: Apartment Home Access: Stairs to enter Secretary/administratorntrance Stairs-Number of Steps: 18 Entrance Stairs-Rails: Can reach both Home Layout: One level     Bathroom Shower/Tub: Chief Strategy OfficerTub/shower unit   Bathroom Toilet: Standard     Home Equipment: Cane - single point   Additional Comments: per PT eval, can stay with daughters but  they have steps too; was up/down steps preoperatively so expect can recover...      Prior Functioning/Environment Level of Independence: Independent with assistive device(s)             OT Diagnosis: Acute pain   OT Problem List: Obesity;Pain;Decreased knowledge of precautions;Decreased knowledge of use of DME or AE;Decreased range of motion;Decreased activity tolerance;Increased edema   OT Treatment/Interventions: Self-care/ADL training;DME and/or AE instruction;Energy conservation;Therapeutic activities;Patient/family education;Balance training    OT Goals(Current goals can be found in the care plan section) Acute Rehab OT Goals Patient Stated Goal: not stated  OT Goal  Formulation: With patient Time For Goal Achievement: 04/26/15 Potential to Achieve Goals: Good ADL Goals Pt Will Perform Lower Body Dressing: with set-up;sit to/from stand;with supervision Pt Will Transfer to Toilet: with modified independence;ambulating Pt Will Perform Tub/Shower Transfer: Tub transfer;with supervision;rolling walker;ambulating;3 in 1  OT Frequency: Min 2X/week   Barriers to D/C:            Co-evaluation              End of Session Equipment Utilized During Treatment: Gait belt;Rolling walker Nurse Communication: Mobility status;Other (comment) (HR; d/c recommendation)  Activity Tolerance: Other (comment) (increased HR but otherwise tolerated session well) Patient left: in bed;with call bell/phone within reach   Time: 4782-9562 OT Time Calculation (min): 17 min Charges:  OT General Charges $OT Visit: 1 Procedure OT Evaluation $Initial OT Evaluation Tier I: 1 Procedure G-CodesEarlie Raveling OTR/L [phone] 04/19/2015, 11:11 AM

## 2015-04-19 NOTE — Progress Notes (Signed)
[place] TEAM 1 - Stepdown/ICU TEAM PROGRESS NOTE  Cassandra [name] ZOX:[id number] DOB: 1945-09-14 DOA: 04/12/2015 PCP: Pcp Not In System  Admit HPI / Brief Narrative: 69 y.o.F Hx HTN who presented w/ c/o L leg pain that started around 23:00 on 04/11/15. Acute onset when ambulating from chair to bed. Associated w/ numbness in the foot but denies back pain.  Associated, SOB x3 days but no CP, cough, fevers, palpitations.  In the ED a CT noted large B central and lobar PE.    HPI/Subjective: The patient is up ambulating in the hallway w/ therapy.  She denies chest pain shortness breath fevers chills nausea or vomiting.  She denies pain in her left leg.  Assessment/Plan:  Pulmonary embolism -CT noted large bilateral central and lobar PE, with right cardiac strain - D-dimer 14.52 - PESI class I -Heparin started in the ED - Dr. 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  -transitioned back to Xarelto 12/11 - if no bleeding from surgical wounds, will likely be stable for d/c 12/13  Acute arterial insuff L LE - limb threatening arterial thrombus L LE  -care per [facility] Surgery - s/p surgical correction - appears to be healing well - exact etiology remains unclear - no PFO note at time of TTE, but this could be an intermittent issue dependent upon chamber pressure fluctuation - lifelong anticoag felt to be in her best interest at this time - I discussed this w/ her at length 12/11 and she voiced understanding   Normocytic anemia -likely consumption related to extensive arterial and venous clotting, as well as surgical loss - need to assure stabilizes prior to d/c, as remains in a downward trend at present - recheck CBC in AM   Acute kidney failure - unknown recent baseline  -crt baseline 1.0 in 2012   -discontinued ACEI/ARB -crt appears to be improving now - recheck in AM   Hyperglycemia -185 on admission - no h/o DM - A1c 5.6 - likely simple stress reaction   Hypokalemia  -corrected   HTN -BP well controlled   Dependent edema -Resolved  Code Status: FULL Family Communication: no family present at time of exam Disposition Plan: if Hgb remains stable on Xarelto, crt is stable/improved, and pt ambulates w/o difficulty will likely be ready for d/c home 12/13  Consultants: none  Procedures: 12/5 CT angiogram chest PE protocol - Large bilateral central and lobar pulmonary artery emboli with findings concerning for a degree of right cardiac straining. 12/6 TTE - LVEF 55%- 60%. - Right ventricle: moderately dilated - Right atrium: mildly dilated.- Pulmonary arteries: PA peak pressure: 39 mm Hg (S) 12/9 left leg embolectomy via a left popliteal approach, required a return visit to the operating room for bleeding  Antibiotics: none  DVT prophylaxis: Xarelto   Objective: Blood pressure 134/64, pulse 94, temperature 98.2 F (36.8 C), temperature source Oral, resp. rate 18, height  (1.6 m), weight 109.8 kg (242 lb 1 oz), SpO2 100 %.  Intake/Output Summary (Last 24 hours) at 04/19/15 1528 Last data filed at 04/19/15 1345  Gross per 24 hour  Intake 500.17 ml  Output   2215 ml  Net -1714.83 ml   Exam: General: no acute distress  Lungs: Clear to auscultation bilaterally Cardiovascular: Regular rate and rhythm without murmur Abdomen:  Nontender, nondistended, soft Extremities: No significant clubbing, or edema bilateral lower extremities  Data Reviewed: Basic Metabolic Panel:  Recent Labs Lab 04/14/15 0447 04/15/15 0231 04/16/15 0500 04/16/15 0858 04/16/15 1329 04/17/15 0450 04/18/15 0343 04/19/15 0253  NA 139 139 139 140 139 138 141 139  K 4.6 4.8 4.2 4.6 6.5* 4.2 4.1 3.7  CL 110 110 111  --   --  110 111 109  CO2 23 22 21*  --   --  19* 23 23  GLUCOSE  105* 92 96  --  141* 90 112* 98  BUN --   --  CREATININE 1.13* 1.16* 1.20*  --   --  1.23* 1.23* 1.11*  CALCIUM 8.8* 8.9 8.6*  --   --  7.9* 8.1* 8.4*  MG 1.7  --   --   --   --   --   --   --   PHOS  --  2.8  --   --   --   --  2.7  --     CBC:  Recent Labs Lab 04/15/15 0231 04/16/15 0500 04/16/15 0858 04/16/15 1329 04/17/15 0450 04/18/15 0343 04/19/15 0253  WBC 6.8 7.3  --   --  10.3 8.5 6.8  HGB 10.7* 10.8* 9.9* 11.2* 9.6* 9.4* 8.9*  HCT 32.8* 32.0* 29.0* 33.0* 28.8* 28.2* 27.5*  MCV 91.9 91.4  --   --  92.6 92.8 92.9  PLT 187 216  --   --  185 211 210    Liver Function Tests:  Recent Labs Lab 04/14/15 0447 04/15/15 0231 04/18/15 0343 04/19/15 0253  AST 43*  --   --  32  ALT 25  --   --  18  ALKPHOS 67  --   --  56  BILITOT 0.4  --   --  0.6  PROT 6.6  --   --  5.8*  ALBUMIN 3.0* 3.0* 2.7* 2.7*   Cardiac Enzymes:  Recent Labs Lab 04/12/15 1826 04/13/15 0058 04/13/15 0600  TROPONINI 0.07* 0.07* 0.07*    CBG:  Recent Labs Lab 04/12/15 1644 04/12/15 2334  GLUCAP 137* 103*    Recent Results (from the past 240 hour(s))  MRSA PCR Screening     Status: None   Collection Time: 04/12/15  6:39 AM  Result Value Ref Range Status   MRSA by PCR NEGATIVE NEGATIVE Final    Comment:        The GeneXpert MRSA Assay (FDA approved for NASAL specimens only), is one component of a comprehensive MRSA colonization surveillance program. It is not intended to diagnose MRSA infection nor to guide or monitor treatment for MRSA infections.      Studies:   Recent x-ray studies have been reviewed in detail by the Attending Physician  Scheduled Meds:  Scheduled Meds: . antiseptic oral rinse  7 mL Mouth Rinse BID  . docusate sodium  100 mg Oral Daily  . Rivaroxaban  15 mg Oral BID WC   Followed by  . [START ON 05/09/2015] rivaroxaban  20 mg Oral Q supper  . sodium chloride  3 mL Intravenous Q12H    Time spent on care of this patient: 25  mins   [facility] T , MD   [facility] Office  [phone] Pager - Text Page per Loretha Stapler as per below:  On-Call/Text Page:      [email or web address]      password [email or web address]  If 7PM-7AM, please contact  night-coverage [email or web address] Password [email or web address] 04/19/2015, 3:28 PM   LOS: 7 days

## 2015-04-19 NOTE — Care Management Note (Addendum)
Case Management Note  Patient Details  Name: Dolphus JennyHelen H Karpel MRN: [id number] Date of Birth: [birth year]  Subjective/Objective:                 Admitted with  L leg pain/SOB,  In the ED a CT noted large B central and lobar PE.12/09: Underwent left leg embolectomy.    Action/Plan: Return to home with hh services. CM to f/u with d/c needs.  Pt will be discharged home on Xarelto  Expected Discharge Date:               Expected Discharge Plan:  Home w Home Health Services  In-House Referral:     Discharge planning Services  CM Consult  Post Acute Care Choice:    Choice offered to:  Patient  DME Arranged:  Walker rolling, 3-N-1 DME Agency:  [facility]  HH Arranged:  PT, OT HH Agency:  [facility]  Status of Service:  Complete, will sign off  Medicare Important Message Given:    Date Medicare IM Given:    Medicare IM give by:    Date Additional Medicare IM Given:    Additional Medicare Important Message give by:     If discussed at Long Length of Stay Meetings, dates discussed:    Additional Comments: 04/20/15  Xarelto prescription faxed directly to pharmacy, per pharmacy; prior auth is required, pharmacy sent prior auth request directly to PCP, unable to provide copay until prior auth requirement is satisfied.  Pt has free 30 day card and instructed to contact PCP to ensure prior Berkley Harveyauth is complete prior to depleting free 30 day supply.  04/19/2015  CM [facility] pt [facility], [facility] and [facility] accepted.  CM provided free 30 day Xarelto card to pt, submitted benefit check.  CMA unable to obtain copay amount/need for prior authorization, insurance recommended script to be faxed to pharmacy to determine cost, CM requsted MD to fax script to preferred [facility] [address] [facility] [place] [facility] physician sticky note.  CM contacted pharmacy and was informed that script can be filled at discharge.  CM spoke with pt regarding d/c planning. CM  shared with pt  PT's evaluation/recommendation: HHPT. Pt chosed [facility] to provide HHPT services.   Verlon SettingKimberly Copeland (Daughter)  [phone]

## 2015-04-19 NOTE — Progress Notes (Signed)
VASCULAR LAB PRELIMINARY  ARTERIAL  ABI completed:    RIGHT    LEFT    PRESSURE WAVEFORM  PRESSURE WAVEFORM  BRACHIAL 145 Triphasic BRACHIAL 162 Triphasic  DP 141 Triphasic DP 176 Monophasic  AT   AT    PT 169 Triphasic PT 147 Multiphasic  PER   PER    GREAT TOE 98 NA GREAT TOE 58 NA    RIGHT LEFT  ABI 1.04 1.09  TBI 0.6 0.36   Bilateral ABIs are within normal limits. Bilateral TBIs are abnormal.  04/19/2015 3:43 PM Gertie FeyMichelle Yeira Gulden, RVT, RDCS, RDMS

## 2015-04-19 NOTE — Progress Notes (Signed)
JP drain was removed from left inner calf. 5 mL of drainage was emptied prior to removal. Scant bleeding was present after removal. Pressure dressing is in place. Pt tolerated well.   Berdine DanceLauren Moffitt RN, BSN

## 2015-04-20 DIAGNOSIS — I272 Other secondary pulmonary hypertension: Secondary | ICD-10-CM

## 2015-04-20 DIAGNOSIS — I420 Dilated cardiomyopathy: Secondary | ICD-10-CM

## 2015-04-20 DIAGNOSIS — I2699 Other pulmonary embolism without acute cor pulmonale: Secondary | ICD-10-CM | POA: Diagnosis present

## 2015-04-20 LAB — CBC
HEMATOCRIT: 27.4 % — AB (ref 36.0–46.0)
HEMOGLOBIN: 9.1 g/dL — AB (ref 12.0–15.0)
MCH: 30.7 pg (ref 26.0–34.0)
MCHC: 33.2 g/dL (ref 30.0–36.0)
MCV: 92.6 fL (ref 78.0–100.0)
PLATELETS: 231 10*3/uL (ref 150–400)
RBC: 2.96 MIL/uL — AB (ref 3.87–5.11)
RDW: 14.4 % (ref 11.5–15.5)
WBC: 6.2 10*3/uL (ref 4.0–10.5)

## 2015-04-20 LAB — TYPE AND SCREEN
ABO/RH(D): B POS
ANTIBODY SCREEN: NEGATIVE
UNIT DIVISION: 0
Unit division: 0

## 2015-04-20 LAB — BASIC METABOLIC PANEL
ANION GAP: 7 (ref 5–15)
BUN: 8 mg/dL (ref 6–20)
CHLORIDE: 106 mmol/L (ref 101–111)
CO2: 24 mmol/L (ref 22–32)
Calcium: 8.7 mg/dL — ABNORMAL LOW (ref 8.9–10.3)
Creatinine, Ser: 1.12 mg/dL — ABNORMAL HIGH (ref 0.44–1.00)
GFR calc Af Amer: 56 mL/min — ABNORMAL LOW (ref >60)
GFR, EST NON AFRICAN AMERICAN: 49 mL/min — AB (ref >60)
GLUCOSE: 102 mg/dL — AB (ref 65–99)
POTASSIUM: 4 mmol/L (ref 3.5–5.1)
Sodium: 137 mmol/L (ref 135–145)

## 2015-04-20 MED ORDER — METOPROLOL TARTRATE 25 MG PO TABS: 12.5000 mg | ORAL_TABLET | Freq: Two times a day (BID) | ORAL | Status: DC

## 2015-04-20 MED ORDER — OXYCODONE HCL 5 MG PO TABS: 5.0000 mg | ORAL_TABLET | ORAL | Status: DC | PRN

## 2015-04-20 MED ORDER — METOPROLOL TARTRATE 12.5 MG HALF TABLET: 12.5000 mg | ORAL_TABLET | Freq: Two times a day (BID) | ORAL | Status: DC

## 2015-04-20 MED ORDER — RIVAROXABAN 15 MG PO TABS: 15.0000 mg | ORAL_TABLET | Freq: Two times a day (BID) | ORAL | Status: DC

## 2015-04-20 MED ORDER — RIVAROXABAN 20 MG PO TABS: 20.0000 mg | ORAL_TABLET | Freq: Every day | ORAL | Status: DC

## 2015-04-20 NOTE — Progress Notes (Signed)
  Progress Note    04/20/2015 8:06 AM 4 Days Post-Op  Subjective:  No complaints-ready to go home  Afebrile VSS  Filed Vitals:   04/19/15 2037 04/20/15 0340  BP: 124/51 142/58  Pulse: 79 92  Temp: 98.7 F (37.1 C) 98.7 F (37.1 C)  Resp: 18 18    Physical Exam: Cardiac:  regular Lungs:  Non labored Incisions:  Healing nicely Extremities:  3+ palpable left DP; left foot is warm and well perfused.   CBC    Component Value Date/Time   WBC 6.2 04/20/2015 0516   RBC 2.96* 04/20/2015 0516   HGB 9.1* 04/20/2015 0516   HCT 27.4* 04/20/2015 0516   PLT 231 04/20/2015 0516   MCV 92.6 04/20/2015 0516   MCH 30.7 04/20/2015 0516   MCHC 33.2 04/20/2015 0516   RDW 14.4 04/20/2015 0516   LYMPHSABS 1.4 04/12/2015 0230   MONOABS 0.8 04/12/2015 0230   EOSABS 0.1 04/12/2015 0230   BASOSABS 0.0 04/12/2015 0230    BMET    Component Value Date/Time   NA 137 04/20/2015 0516   K 4.0 04/20/2015 0516   CL 106 04/20/2015 0516   CO2 24 04/20/2015 0516   GLUCOSE 102* 04/20/2015 0516   BUN 8 04/20/2015 0516   CREATININE 1.12* 04/20/2015 0516   CALCIUM 8.7* 04/20/2015 0516   GFRNONAA 49* 04/20/2015 0516   GFRAA 56* 04/20/2015 0516    INR    Component Value Date/Time   INR 1.36 04/16/2015 0500     Intake/Output Summary (Last 24 hours) at 04/20/15 0806 Last data filed at 04/20/15 0341  Gross per 24 hour  Intake    720 ml  Output   2005 ml  Net  -1285 ml     Assessment:  69 y.o. female is s/p:  Popliteal embolectomy left leg and take back for evacuation of hematoma 4 Days Post-Op  Plan: -pt doing well this morning with easily 3+ palpable left DP pulse -mobilizing well -DVT prophylaxis:  Xarelto -pt discharging today -f/u with Dr. Hart RochesterLawson in 2 weeks (office will call to arrange appt). -she will call sooner if she has any issues   Doreatha MassedSamantha Rhyne, PA-C [facility] [phone] 04/20/2015 8:06 AM

## 2015-04-20 NOTE — Progress Notes (Signed)
Last review completed on  pt discharged .  Review completed within 72 hours of discharge, no additional review needed  

## 2015-04-20 NOTE — Discharge Summary (Addendum)
Physician Discharge Summary  Cassandra [name] WUJ:[id number] DOB: 12/26/1945 DOA: 04/12/2015  PCP: Pcp Not In System  Admit date: 04/12/2015 Discharge date: 04/20/2015  Time spent: 50 minutes  Recommendations for Outpatient Follow-up:  Pulmonary embolism: - CT scan showing large bilateral central and lobar PE, with right cardiac strain. D-dimer 14.52. PESI class I.  -Continue Xarelto per discharge instruction  - Initiate hypercoagulation workup once off anticoagulation in 3-6 mo - protein C, Protein S, antithrombin, Factor V, prothrombin - Echocardiogram; C/W right heart strain  Positive for DVT Lt popliteal, posterior tibial, and peroneal veins. -Surgery by vascular femoropopliteal 12/9 -Xarelto per instructions  AKI vs CKD: Cr 1.36. Previous 1.0 in 2012.  -Significantly improve however not down to baseline from 2012. Since BP controlled wouldn't hold on restarting ACEI/ARB -Hold all nephrotoxic medication -PCP will determine if/when restart ARB/ACEI  Hyperglycemia:  -185 on admission. No h/o DM. - 12/5 hemoglobin A1c= 5.6  Hypokalemia:  -Resolved   HTN: -See AKI -If BP medication needed would start on beta blocker, CCB or hydralazine  Dilated cardiomyopathy (mild) -Will start patient on small dose of metoprolol 12.5 mg BID  -PCP to titrate   Pulmonary hypertension (mild) -See dilated cardiomyopathy  Dependent edema: -LLE swelling appropriate for surgery. -Patient counseled to elevate leg when not ambulating, to return to ED if leg becomes tender, swelling increases, loss of feeling.     Discharge Diagnoses:  Active Problems:   Pulmonary embolism (HCC)   Hyperglycemia   AKI (acute kidney injury) (HCC)   Hypokalemia   Essential hypertension   Dependent edema   PE (pulmonary embolism)   Paradoxical embolus (HCC)   Acute right heart failure (HCC)   Acute on chronic kidney failure (HCC)   Dilated cardiomyopathy (HCC)   Pulmonary hypertension (HCC)   Acute  pulmonary embolism (HCC)   Discharge Condition: Stable  Diet recommendation: Heart healthy  Filed Weights   04/12/15 0129 04/12/15 0520 04/12/15 0639  Weight: 108.863 kg (240 lb) 108.863 kg (240 lb) 109.8 kg (242 lb 1 oz)    History of present illness:  69 y.o.BF PMHx HTN Leg pain. L leg. Started around 23:00 on 04/11/15. Acute onset when ambulating from chair to bed. L hip radiates down to foot. Associated w/ numbness in the foot but denies back pain. Ibuprofen w/o relief. Worse w/ ambulation. Constant. No loss of bowel or bladder function or saddle anesthesia. No recent changes to medications. Associated, SOB x3 days but no CP, cough, fevers, palpitations. Recent   Echo and CXR from PCP a couple days ago were nml.  During his hospitalization patient was found to have large central/lobar PE, as well as DVT left popliteal, posterior tibial) veins. Patient was started on appropriate anticoagulation and on 12/9 vascular surgery initially performed Left LegTrans-popliteal Embolectomy Lt iliac, Lt popliteal left anterior tibial, Lt tibio- peroneal trunk. LLE  Bovine Patch Angioplasty. Later that day patient return to OR for Evacuation Hematoma Lt POPLITEAL ARTERY. Since second surgery patient has done well able to ambulate, good pulse LLE.   Procedure/Significant Events: 12/5 CT angiogram chest PE protocol;Large bilateral central and lobar pulmonary artery emboli with findings concerning for a degree of right cardiac straining. 12/6 echocardiogram;- LVEF 55%- 60%. - Right ventricle: moderately dilated.  - Right atrium: mildly dilated.- Pulmonary arteries: PA peak pressure: 39 mm Hg (S). 12/7 bilateral lower extremity Doppler ultrasound-Rt lower extremity is negative DVT  -Lt lower extremity is positive for DVT Lt popliteal, posterior tibial, and peroneal veins.  12/9 Left LegTrans-popliteal Embolectomy Lt iliac, Lt popliteal left anterior tibial, Lt tibio- peroneal trunk. LLE  Bovine Patch  Angioplasty . 12/9; Evacuation Hematoma Lt POPLITEAL ARTERY    Discharge Exam: Filed Vitals:   04/19/15 1345 04/19/15 2037 04/20/15 0340 04/20/15 1419  BP: 134/64 124/51 142/58 146/66  Pulse: 94 79 92 92  Temp: 98.2 F (36.8 C) 98.7 F (37.1 C) 98.7 F (37.1 C) 98.2 F (36.8 C)  TempSrc: Oral Oral Oral Oral  Resp: Height:      Weight:      SpO2: 100% 97% 97% 96%    General: A/O 4, NAD, No acute respiratory distress Eyes: Negative headache, eye pain, double vision,negative scleral hemorrhage ENT: Negative Runny nose, negative ear pain, negative gingival bleeding, Neck: Negative scars, masses, torticollis, lymphadenopathy, JVD Lungs: Clear to auscultation bilaterally without wheezes or crackles Cardiovascular: Regular rate and rhythm without murmur gallop or rub normal S1 and S2 Extremities: No significant cyanosis, clubbing. LLE swelling of calf, without tenderness to palpation. Incision site/medial aspect LLE/left eye clean without sign of infection erythema or pain to palpation.  Discharge Instructions     Medication List    TAKE these medications        benazepril 40 MG tablet  Commonly known as:  LOTENSIN  Take 40 mg by mouth daily.     furosemide 40 MG tablet  Commonly known as:  LASIX  Take 40 mg by mouth daily.     furosemide 40 MG tablet  Commonly known as:  LASIX  Take 40 mg by mouth daily.     metoprolol tartrate 25 MG tablet  Commonly known as:  LOPRESSOR  Take 0.5 tablets (12.5 mg total) by mouth 2 (two) times daily.     oxyCODONE 5 MG immediate release tablet  Commonly known as:  Oxy IR/ROXICODONE  Take 1 tablet (5 mg total) by mouth every 4 (four) hours as needed for moderate pain.     Rivaroxaban 15 MG Tabs tablet  Commonly known as:  XARELTO  Take 1 tablet (15 mg total) by mouth 2 (two) times daily with a meal.     rivaroxaban 20 MG Tabs tablet  Commonly known as:  XARELTO  Take 1 tablet (20 mg total) by mouth daily with  supper.  Start taking on:  05/11/2015       No Known Allergies Follow-up Information    Follow up with Josephina Gip, MD In 3 weeks.   Specialties:  Vascular Surgery, Interventional Cardiology, Cardiology   Why:  Office will call you to arrange your appt (sent)   Contact information:   [address] [place] [address] [phone]       Follow up with [facility].   Why:  3:1, RW   Contact information:   [address] [place] [address] [phone]       Follow up with [facility].   Why:  physical therapy, occupational therapy   Contact information:   [address] [place] [address] [phone]        The results of significant diagnostics from this hospitalization (including imaging, microbiology, ancillary and laboratory) are listed below for reference.    Significant Diagnostic Studies: Dg Lumbar Spine Complete  04/12/2015  CLINICAL DATA:  Pain radiating to LEFT leg with numbness, no injury. EXAM: LUMBAR SPINE - COMPLETE 4+ VIEW COMPARISON:  CT abdomen and pelvis June 15, 2011 FINDINGS: Lumbar vertebral  bodies are intact and aligned with maintenance of the lumbar lordosis. Intervertebral disc heights are normal. No destructive bony lesions. No pars interarticularis defects. Moderate to severe lower lumbar facet arthropathy. Sacroiliac joints are symmetric. Included prevertebral and paraspinal soft tissue planes are non-suspicious. Surgical clips in the included right abdomen compatible with cholecystectomy. Phleboliths project in the pelvis. IMPRESSION: No acute fracture deformity or malalignment. Moderate to severe lower lumbar facet arthropathy. Electronically Signed   By: Awilda Metro M.D.   On: 04/12/2015 02:34   Ct Angio Chest Pe W/cm &/or Wo Cm  04/12/2015  CLINICAL DATA:  56-year-old female with pain in the left leg and shortness of breath x3 days. EXAM: CT ANGIOGRAPHY CHEST WITH CONTRAST TECHNIQUE:  Multidetector CT imaging of the chest was performed using the standard protocol during bolus administration of intravenous contrast. Multiplanar CT image reconstructions and MIPs were obtained to evaluate the vascular anatomy. CONTRAST:  OMNIPAQUE IOHEXOL 350 MG/ML SOLN COMPARISON:  Chest radiograph dated 04/08/2015 FINDINGS: The lungs are clear. There is no pleural effusion or pneumothorax. The central airways are patent. The thoracic aorta is unremarkable. There are large bilateral central and lobar pulmonary artery emboli. There is mild dilatation of the main pulmonary trunk as well as dilatation of the right cardiac chambers compatible with a degree of right heart strain. There is no cardiomegaly or pericardial effusion. No hilar or mediastinal adenopathy. The visualized esophagus and thyroid gland are grossly unremarkable. There is no axillary adenopathy. Right chest wall subcutaneous calcified nodules likely related to an old insult. There is mild degenerative changes of the spine. No acute fracture. A 2.5 cm partially visualized left renal cyst all seen on the prior study. There is stable calcification of the posterior aspect of the right lobe of the liver. Review of the MIP images confirms the above findings. IMPRESSION: Large bilateral central and lobar pulmonary artery emboli with findings concerning for a degree of right cardiac straining. Critical Value/emergent results were called by telephone at the time of interpretation on 04/12/2015 at 4:28 am to Nurse Katherine Roan who verbally acknowledged these results. Electronically Signed   By: Elgie Collard M.D.   On: 04/12/2015 04:36   Ct Angio Ao+bifem W/cm &/or Wo/cm  04/14/2015  CLINICAL DATA:  Acute left lower extremity leg pain, diminished asymmetric femoral pulses concerning for embolic disease. History of recent bilateral pulmonary emboli. EXAM: CT ANGIOGRAPHY OF ABDOMINAL AORTA WITH ILIOFEMORAL RUNOFF TECHNIQUE: Multidetector CT imaging of the  abdomen, pelvis and lower extremities was performed using the standard protocol during bolus administration of intravenous contrast. Multiplanar CT image reconstructions and MIPs were obtained to evaluate the vascular anatomy. CONTRAST:  OMNIPAQUE IOHEXOL 350 MG/ML SOLN COMPARISON:  04/12/2015 FINDINGS: Lower chest: There is re- demonstration of bilateral lower lobe pulmonary emboli, thrombus burden is largest in the right lower lobe. Normal heart size. No pericardial or pleural effusion. Dependent basilar atelectasis evident. Small hiatal hernia noted. Aorta: Intact abdominal aorta. No significant atherosclerotic disease. Minor wall thickening. Negative for aneurysm or occlusive process. Patent bifurcation. Celiac, SMA, single renal arteries, and IMA are all patent. No evidence of mesenteric or renal vascular occlusive disease. Right Lower Extremity: The right common, internal and external iliac arteries are mildly tortuous but patent. No significant right iliac occlusive disease, stenosis or dissection. No aneurysm. Right common femoral, profunda femoral, and superficial femoral arteries are patent. Right popliteal artery is patent across the knee. Below the knee the popliteal artery remains patent. Tibioperoneal trunk is patent. There is  preserved three-vessel runoff in the right lower extremity. Left Lower Extremity: Hypodense elongated filling defects within the left common, internal and external iliac arteries. These defects are nearly occlusive and compatible with left iliac thromboemboli. Distal left external iliac artery is patent. Left common femoral, profunda femoral, and superficial femoral arteries are patent. Just above the knee, there is abrupt occlusion of the popliteal artery. Popliteal thrombotic occlusion extends across the knee. There is reconstitution of the tibial and peroneal runoff which is faintly visualized. Popliteal findings are compatible with a popliteal thrombo embolus.  Nonvascular: Prior cholecystectomy noted. Chronic capsular calcifications along the right liver posteriorly. Stable mild biliary prominence. Stable porta hepatis prominent lymph nodes. Spleen, pancreas, and adrenal glands are within normal limits for arterial phase imaging. Kidneys demonstrate hypodense bilateral renal cysts without obstruction or hydronephrosis. Negative for bowel obstruction, dilatation, ileus, or free air. No abdominal or pelvic free fluid. Uterus and adnexa unremarkable. Urinary bladder unremarkable. No inguinal abnormality or hernia. Degenerative changes of the spine and pelvis. No acute osseous finding or compression fracture. Review of the MIP images confirms the above findings. IMPRESSION: Acute nearly occlusive left common, internal, and external iliac thromboemboli. Acute occlusive left popliteal thrombo embolus. No significant right lower extremity vascular process or embolus. Patent mesenteric and renal vasculature. No significant abdominal aortic abnormality or vascular disease. Bilateral pulmonary emboli These results were called by telephone at the time of interpretation on 04/14/2015 at 2:11 Pm to Dr. Clinton GallantEMMA COLLINS , who verbally acknowledged these results. Electronically Signed   By: Judie PetitM.  Shick M.D.   On: 04/14/2015 17:03   Dg Ang/ext/uni/or Left  04/16/2015  CLINICAL DATA:  Popliteal acute thromboembolus EXAM: LEFT ANG/EXT/UNI/ OR CONTRAST:  See operative report FLUOROSCOPY TIME:  Number of Acquired Images:  1 COMPARISON:  04/14/2015 FINDINGS: Portable intraoperative imaging performed of the left lower extremity centered below the knee. Injection demonstrates patency of the below-knee popliteal artery. Diffuse atherosclerotic changes. Anterior tibial, tibioperoneal trunk, peroneal, and posterior tibial arteries are patent. No occlusive process. IMPRESSION: Patent 3 vessel runoff. Electronically Signed   By: Judie PetitM.  Shick M.D.   On: 04/16/2015 11:32   Dg Hip Unilat With Pelvis 1v  Left  04/12/2015  CLINICAL DATA:  Pain radiating to LEFT leg with numbness, no injury. EXAM: DG HIP (WITH OR WITHOUT PELVIS) 1V*L* COMPARISON:  None. FINDINGS: There is no evidence of hip fracture or dislocation. Moderate RIGHT hip joint space narrowing and femoral head spurring consistent with osteoarthrosis, very mild on the LEFT. Phleboliths and surgical clips project in the pelvis. IMPRESSION: No acute fracture deformity or dislocation. Moderate RIGHT, mild LEFT hip osteoarthrosis. Electronically Signed   By: Awilda Metroourtnay  Bloomer M.D.   On: 04/12/2015 02:25    Microbiology: Recent Results (from the past 240 hour(s))  MRSA PCR Screening     Status: None   Collection Time: 04/12/15  6:39 AM  Result Value Ref Range Status   MRSA by PCR NEGATIVE NEGATIVE Final    Comment:        The GeneXpert MRSA Assay (FDA approved for NASAL specimens only), is one component of a comprehensive MRSA colonization surveillance program. It is not intended to diagnose MRSA infection nor to guide or monitor treatment for MRSA infections.      Labs: Basic Metabolic Panel:  Recent Labs Lab 04/14/15 0447 04/15/15 0231 04/16/15 0500  04/16/15 1329 04/17/15 0450 04/18/15 0343 04/19/15 0253 04/20/15 0516  NA 139 139 139  < > 139 138 141 139 137  K  4.6 4.8 4.2  < > 6.5* 4.2 4.1 3.7 4.0  CL 110 110 111  --   --  110 111 109 106  CO2 23 22 21*  --   --  19* 23 23 24   GLUCOSE 105* 92 96  --  141* 90 112* 98 102*  BUN 8 7 6   --   --  7 8 7 8   CREATININE 1.13* 1.16* 1.20*  --   --  1.23* 1.23* 1.11* 1.12*  CALCIUM 8.8* 8.9 8.6*  --   --  7.9* 8.1* 8.4* 8.7*  MG 1.7  --   --   --   --   --   --   --   --   PHOS  --  2.8  --   --   --   --  2.7  --   --   < > = values in this interval not displayed. Liver Function Tests:  Recent Labs Lab 04/14/15 0447 04/15/15 0231 04/18/15 0343 04/19/15 0253  AST 43*  --   --  32  ALT 25  --   --  18  ALKPHOS 67  --   --  56  BILITOT 0.4  --   --  0.6  PROT 6.6   --   --  5.8*  ALBUMIN 3.0* 3.0* 2.7* 2.7*   No results for input(s): LIPASE, AMYLASE in the last 168 hours. No results for input(s): AMMONIA in the last 168 hours. CBC:  Recent Labs Lab 04/16/15 0500  04/16/15 1329 04/17/15 0450 04/18/15 0343 04/19/15 0253 04/20/15 0516  WBC 7.3  --   --  10.3 8.5 6.8 6.2  HGB 10.8*  < > 11.2* 9.6* 9.4* 8.9* 9.1*  HCT 32.0*  < > 33.0* 28.8* 28.2* 27.5* 27.4*  MCV 91.4  --   --  92.6 92.8 92.9 92.6  PLT 216  --   --  185 211 210 231  < > = values in this interval not displayed. Cardiac Enzymes: No results for input(s): CKTOTAL, CKMB, CKMBINDEX, TROPONINI in the last 168 hours. BNP: BNP (last 3 results) No results for input(s): BNP in the last 8760 hours.  ProBNP (last 3 results) No results for input(s): PROBNP in the last 8760 hours.  CBG: No results for input(s): GLUCAP in the last 168 hours.     Signed:  Carolyne Littles, MD [facility] [phone] pager

## 2015-04-20 NOTE — Progress Notes (Signed)
Patient discharged to home with daughter.  Patient transported to discharge via wheelchair by the NT. Patient without complaint. IV and telemetry D/C'd. Discharge instructions reviewed with patients and she stated no questions.  Charge RN explained to patient that she needs to give drug card to pharmacy to receive her Xarelto for free the first month. Patient states that she understands.

## 2015-04-20 NOTE — Care Management Important Message (Signed)
Important Message  Patient Details  Name: Cassandra [name] MRN: [id number] Date of Birth: 1945-07-18   Medicare Important Message Given:  Yes    Sten Dematteo Abena 04/20/2015, 10:01 AM

## 2015-04-21 NOTE — Anesthesia Postprocedure Evaluation (Signed)
Anesthesia Post Note  Patient: Cassandra [name]  Procedure(s) Performed: Procedure(s) (LRB): Left LegTransPOPLITEAL EMBOLECTOMY of left iliac, left popliteal left anterior tibial, left tibial peroneal trunkPOSSIBLE FEMORAL EMBOLECTOMY (Left) LOWER EXTREMITY ANGIOGRAM (Left) Bovine PATCH ANGIOPLASTY (Left)  Patient location during evaluation: PACU Anesthesia Type: General Level of consciousness: awake and alert Pain management: pain level controlled Vital Signs Assessment: post-procedure vital signs reviewed and stable Respiratory status: spontaneous breathing, nonlabored ventilation, respiratory function stable and patient connected to nasal cannula oxygen Cardiovascular status: blood pressure returned to baseline and stable Postop Assessment: no signs of nausea or vomiting Anesthetic complications: no    Last Vitals:  Filed Vitals:   04/20/15 0340 04/20/15 1419  BP: 142/58 146/66  Pulse: 92 92  Temp: 37.1 C 36.8 C  Resp: 18 18    Last Pain:  Filed Vitals:   04/20/15 1420  PainSc: 0-No pain                 Carylon Tamburro,JAMES TERRILL

## 2015-04-28 ENCOUNTER — Encounter: Payer: Self-pay | Admitting: Vascular Surgery

## 2015-05-04 ENCOUNTER — Encounter: Payer: Self-pay | Admitting: Vascular Surgery

## 2015-05-04 ENCOUNTER — Ambulatory Visit (INDEPENDENT_AMBULATORY_CARE_PROVIDER_SITE_OTHER): Payer: Self-pay | Admitting: Vascular Surgery

## 2015-05-04 VITALS — BP 132/81 | HR 82 | Temp 99.0°F | Ht 63.0 in | Wt 232.5 lb

## 2015-05-04 DIAGNOSIS — I739 Peripheral vascular disease, unspecified: Secondary | ICD-10-CM

## 2015-05-04 NOTE — Progress Notes (Signed)
Patient name: Cassandra [name] Mixon MRN: [id number] DOB: Sep 27, 1945 Sex: female  REASON FOR VISIT: Follow-up  HPI: Cassandra [name] Kludt is a 69 y.o. female who presents for follow-up status post left trans-popliteal embolectomy of left iliac, left popliteal, left anterior tibial, left tibioperoneal trunk with bovine patch angioplasty left popliteal artery 04/16/15. She also required a return trip to the OR for evacuation of left popliteal exposure hematoma. She was originally admitted for pulmonary embolism. It was suspected that she embolized to her left leg through a patent foramen ovale. She was started on on Xarelto.  Today she denies any pain with her left leg. She does complain of intermittent left great toe pain. She has a prior history of gout. She has some swelling in her left leg that improves with elevation. She mentions that she had an episode intermittent left chest tightness that radiates to her left arm and left neck this morning. She had a similar episode last week as well while working with physical therapy. She associates this with elevated blood pressure. She denies any other episodes. She notes mild shortness of breath.  Current Outpatient Prescriptions  Medication Sig Dispense Refill  . metoprolol tartrate (LOPRESSOR) 25 MG tablet Take 0.5 tablets (12.5 mg total) by mouth 2 (two) times daily. 60 tablet 0  . oxyCODONE (OXY IR/ROXICODONE) 5 MG immediate release tablet Take 1 tablet (5 mg total) by mouth every 4 (four) hours as needed for moderate pain. 30 tablet 0  . Rivaroxaban (XARELTO) 15 MG TABS tablet Take 1 tablet (15 mg total) by mouth 2 (two) times daily with a meal. 30 tablet 0  . [START ON 05/11/2015] rivaroxaban (XARELTO) 20 MG TABS tablet Take 1 tablet (20 mg total) by mouth daily with supper. 30 tablet 3   No current facility-administered medications for this visit.    REVIEW OF SYSTEMS:  [X]  denotes positive finding, [ ]  denotes negative finding Cardiac  Comments:  Chest pain  or chest pressure: x Left intermittent chest tightness radiating to left arm and neck  Shortness of breath upon exertion: x   Short of breath when lying flat:    Irregular heart rhythm:    Constitutional    Fever or chills:      PHYSICAL EXAM: Filed Vitals:   05/04/15 1339  BP: 132/81  Pulse: 82  Temp: 99 F (37.2 C)  TempSrc: Oral  Height: 5\' 3"  (1.6 m)  Weight: 232 lb 8 oz (105.461 kg)  SpO2: 95%    GENERAL: The patient is a well-nourished female, in no acute distress. The vital signs are documented above. CARDIOVASCULAR: There is a regular rate and rhythm. Chest is non-tender to palpation. PULMONARY: Non-labored breathing. Good air exchange bilaterally. VASCULAR: Left below knee incision healing well. Palpable 2+ dorsalis pedis pulses bilaterally. Some mild swelling left leg. Left great toe with normal capillary refill. No swelling or erythema.    MEDICAL ISSUES:  Status post left trans-popliteal embolectomy of left iliac, left popliteal, left anterior tibial, left tibioperoneal trunk with bovine patch angioplasty left popliteal artery 04/16/15  The patient is doing well following her surgery. Her incision is healing well. She has a palpable left dorsalis pedis pulse. She is currently on anticoagulation with Xarelto.  She did comment on intermittent left sided chest tightness today in the office as well as an episode last week. She has associated these episodes with elevated blood pressure. She is in no acute distress. Do not suspect acute coronary syndrome. Advised her that she  must follow-up with her primary care physician regarding this. She will see her PCP  Dr. Lynn Ito immediately following this appointment. She will follow up in our office in 3 months with ABIs.  Maris Berger, PA-C [facility]   agree with above assessment Left foot is well-perfused with 3+ dorsalis pedis pulse palpable  Popliteal incision healing nicely   Continue  anticoagulation and return to see me in 3 months with repeat ABIs   as noted above patient will see her medical doctor today following this appointment

## 2015-05-04 NOTE — Addendum Note (Signed)
Addended by: Adria DillELDRIDGE-LEWIS, Kyndahl Jablon L on: 05/04/2015 03:38 PM   Modules accepted: Orders

## 2015-07-15 ENCOUNTER — Encounter: Payer: Self-pay | Admitting: Interventional Cardiology

## 2015-07-15 ENCOUNTER — Ambulatory Visit (INDEPENDENT_AMBULATORY_CARE_PROVIDER_SITE_OTHER): Payer: Medicare Other | Admitting: Interventional Cardiology

## 2015-07-15 VITALS — BP 140/86 | HR 72 | Ht 64.0 in | Wt 233.2 lb

## 2015-07-15 DIAGNOSIS — R0789 Other chest pain: Secondary | ICD-10-CM

## 2015-07-15 DIAGNOSIS — I509 Heart failure, unspecified: Secondary | ICD-10-CM

## 2015-07-15 DIAGNOSIS — I739 Peripheral vascular disease, unspecified: Secondary | ICD-10-CM

## 2015-07-15 DIAGNOSIS — I50811 Acute right heart failure: Secondary | ICD-10-CM

## 2015-07-15 DIAGNOSIS — I2699 Other pulmonary embolism without acute cor pulmonale: Secondary | ICD-10-CM | POA: Diagnosis not present

## 2015-07-15 DIAGNOSIS — I10 Essential (primary) hypertension: Secondary | ICD-10-CM | POA: Diagnosis not present

## 2015-07-15 DIAGNOSIS — R079 Chest pain, unspecified: Secondary | ICD-10-CM | POA: Insufficient documentation

## 2015-07-15 NOTE — Patient Instructions (Signed)
Medication Instructions:  Your physician recommends that you continue on your current medications as directed. Please refer to the Current Medication list given to you today.   Labwork: None ordered  Testing/Procedures: Your physician has requested that you have a lexiscan myoview. For further information please visit [email or web address]. Please follow instruction sheet, as given.  Your physician has requested that you have an echocardiogram. Echocardiography is a painless test that uses sound waves to create images of your heart. It provides your doctor with information about the size and shape of your heart and how well your heart's chambers and valves are working. This procedure takes approximately one hour. There are no restrictions for this procedure.    Follow-Up: Your physician recommends that you schedule a follow-up appointment in: 4-6 weeks on 08/30/15 @ 9:45am   Any Other Special Instructions Will Be Listed Below (If Applicable).     If you need a refill on your cardiac medications before your next appointment, please call your pharmacy.

## 2015-07-15 NOTE — Progress Notes (Addendum)
Cardiology Office Note   Date:  07/15/2015   ID:  Cassandra [name], Cassandra [name] [birth year], MRN [id number]  PCP:  Lynn Ito, MD  Cardiologist:  Lesleigh Noe, MD   No chief complaint on file.     History of Present Illness: Cassandra [name] is a 70 y.o. female who presents for massive bilateral pulmonary emboli, paradoxical embolus to the left lower extremity, recurring episodes of chest pressure radiating into the neck that preexisted the acute presentation with pulmonary emboli by up to 6 months.  The patient is referred back by her primary care physician and vascular surgery because of a recurring history of chest pressure that radiates to the shoulder and into the neck. The patient states that once the discomfort starts again last up to 24 hours. There are no precipitants. The discomfort is 6 on a scale of 10. She has not found any activity or maneuver that will lessen the discomfort although when his present she feels the need to become inactive. She denies orthopnea, PND, tachycardia palpitations, and near syncope. No significant dyspnea.    Past Medical History  Diagnosis Date  . Hypertension   . Arthritis of knee     Past Surgical History  Procedure Laterality Date  . Cholecystectomy    . Hemorrhoid surgery    . Breast surgery      lumpectomy on Right breast  . Embolectomy Left 04/16/2015    Procedure: Left LegTransPOPLITEAL EMBOLECTOMY of left iliac, left popliteal left anterior tibial, left tibial peroneal trunkPOSSIBLE FEMORAL EMBOLECTOMY;  Surgeon: Pryor Ochoa, MD;  Location: [facility] OR;  Service: Vascular;  Laterality: Left;  . Patch angioplasty Left 04/16/2015    Procedure: Bovine PATCH ANGIOPLASTY;  Surgeon: Pryor Ochoa, MD;  Location: [facility] OR;  Service: Vascular;  Laterality: Left;  . Hematoma evacuation Left 04/16/2015    Procedure: EVACUATION HEMATOMA FROM LEFT LEG POPLITEAL ARTERY EXPOSURE;  Surgeon: Pryor Ochoa, MD;  Location: [facility] OR;  Service: Vascular;  Laterality:  Left;     Current Outpatient Prescriptions  Medication Sig Dispense Refill  . metoprolol tartrate (LOPRESSOR) 25 MG tablet Take 0.5 tablets (12.5 mg total) by mouth 2 (two) times daily. 60 tablet 0  . oxyCODONE (OXY IR/ROXICODONE) 5 MG immediate release tablet Take 1 tablet (5 mg total) by mouth every 4 (four) hours as needed for moderate pain. 30 tablet 0  . rivaroxaban (XARELTO) 20 MG TABS tablet Take 1 tablet (20 mg total) by mouth daily with supper. 30 tablet 3   No current facility-administered medications for this visit.    Allergies:   Review of patient's allergies indicates no known allergies.    Social History:  The patient  reports that she has never smoked. She has never used smokeless tobacco. She reports that she does not drink alcohol or use illicit drugs.   Family History:  The patient's family history includes Diabetes in her brother; Healthy in her mother; Hypertension in her father.    ROS:  Please see the history of present illness.   Otherwise, review of systems are positive for left leg pain and swelling. Has had anxiety since recent pulmonary emboli. Recent embolectomy from the left leg during an episode of pulmonary embolism. Irregular heartbeat..   All other systems are reviewed and negative.    PHYSICAL EXAM: VS:  BP 140/86 mmHg  Pulse 72  Ht  (1.626 m)  Wt 233 lb 3.2 oz (105.779 kg)  BMI 40.01 kg/m2 , BMI  Body mass index is 40.01 kg/(m^2). GEN: Well nourished, well developed, in no acute distress HEENT: normal Neck: no JVD, carotid bruits, or masses Cardiac: RRR.  There is no murmur, rub, or gallop. There is no edema. Respiratory:  clear to auscultation bilaterally, normal work of breathing. GI: soft, nontender, nondistended, + BS MS: no deformity or atrophy Skin: warm and dry, no rash Neuro:  Strength and sensation are intact Psych: euthymic mood, full affect   EKG:  EKG is ordered today. The ekg reveals normal sinus rhythm, poor R-wave  progression, leftward axis, low voltage.        Recent Labs: 04/14/2015: Magnesium 1.7 04/19/2015: ALT 18 04/20/2015: BUN 8; Creatinine, Ser 1.12*; Hemoglobin 9.1*; Platelets 231; Potassium 4.0; Sodium 137    Lipid Panel No results found for: CHOL, TRIG, HDL, CHOLHDL, VLDL, LDLCALC, LDLDIRECT    Wt Readings from Last 3 Encounters:  07/15/15 233 lb 3.2 oz (105.779 kg)  05/04/15 232 lb 8 oz (105.461 kg)  04/12/15 242 lb 1 oz (109.8 kg)      Other studies Reviewed: Additional studies/ records that were reviewed today include: Hospitalization at the time of pulmonary embolus. The findings include echocardiogram from that time was reviewed. Acute right heart enlargement and failure was noted. I reviewed personally the CT scans performed during the hospital stay and there is no evidence of coronary calcification..    ASSESSMENT AND PLAN:  1. Essential hypertension Moderate control  2. PE (pulmonary embolism) Now a least 3 months and to therapy without obvious clinical recurrence  3. PAD (peripheral artery disease) (HCC) Due to paradoxical embolism to the left lower extremity at the time of PE  4. Other chest pain Rule out CAD. Recurring episodes of chest pain preceded by several months the development of PE  5. Acute right heart failure (HCC)  No current clinical evidence    Current medicines are reviewed at length with the patient today.  The patient has the following concerns regarding medicines: None.  The following changes/actions have been instituted:    Stress Myoview with pharmacologic stress  2 Doppler echocardiogram to follow-up on acute right heart failure  Labs/ tests ordered today include:  No orders of the defined types were placed in this encounter.   PROLONGED office visit due to anxiety, requiring prolonged explanation of possible etiologies for chest discomfort, and care management/evaluation. Greater than 50% of the time was spent in conversation,  education, and explanation of differential diagnosis.   Disposition:   FU with HS in 6 week  Signed, Lesleigh NoeSMITH III,HENRY W, MD  07/15/2015 10:09 AM    [facility] [address], [place], [place]  [address] Phone: [phone]) [phone]; Fax: [phone]

## 2015-07-28 ENCOUNTER — Encounter: Payer: Self-pay | Admitting: Vascular Surgery

## 2015-07-29 ENCOUNTER — Telehealth (HOSPITAL_COMMUNITY): Payer: Self-pay | Admitting: *Deleted

## 2015-07-29 NOTE — Telephone Encounter (Signed)
Patient attempted to notify of appointments on 08/04/15. Message when attempted was phone call can't be completed as dialed. Attempted x3. Daughter listed as emergency contact and attempted to call. NA and LM for daughter to call back.Cassandra [name], Adelene IdlerCynthia W

## 2015-08-02 ENCOUNTER — Telehealth (HOSPITAL_COMMUNITY): Payer: Self-pay | Admitting: *Deleted

## 2015-08-02 NOTE — Telephone Encounter (Signed)
       Description: 70 year old female  Provider: Loni Beckwithynthia W Eoin Willden, RN  [facility]       Call Documentation                  Patient attempted to notify of appointments on 08/04/15. Message when attempted was phone call can't be completed as dialed. Attempted x3. Daughter listed as emergency contact and attempted to call. NA and LM for daughter to call back.Herminio Kniskern, Adelene IdlerCynthia W

## 2015-08-03 ENCOUNTER — Encounter: Payer: Self-pay | Admitting: Vascular Surgery

## 2015-08-03 ENCOUNTER — Ambulatory Visit (HOSPITAL_COMMUNITY)
Admission: RE | Admit: 2015-08-03 | Discharge: 2015-08-03 | Disposition: A | Discharge status: Home / Self Care | Payer: Medicare Other | Source: Ambulatory Visit | Attending: Vascular Surgery | Admitting: Vascular Surgery

## 2015-08-03 ENCOUNTER — Telehealth (HOSPITAL_COMMUNITY): Payer: Self-pay

## 2015-08-03 ENCOUNTER — Ambulatory Visit (INDEPENDENT_AMBULATORY_CARE_PROVIDER_SITE_OTHER): Payer: Medicare Other | Admitting: Vascular Surgery

## 2015-08-03 VITALS — BP 180/87 | HR 65 | Temp 98.0°F | Resp 16 | Ht 64.0 in | Wt 233.0 lb

## 2015-08-03 DIAGNOSIS — I739 Peripheral vascular disease, unspecified: Secondary | ICD-10-CM | POA: Diagnosis not present

## 2015-08-03 DIAGNOSIS — I10 Essential (primary) hypertension: Secondary | ICD-10-CM | POA: Insufficient documentation

## 2015-08-03 DIAGNOSIS — Z9889 Other specified postprocedural states: Secondary | ICD-10-CM | POA: Insufficient documentation

## 2015-08-03 DIAGNOSIS — R0989 Other specified symptoms and signs involving the circulatory and respiratory systems: Secondary | ICD-10-CM | POA: Diagnosis present

## 2015-08-03 NOTE — Progress Notes (Signed)
Subjective:     Patient ID: Cassandra [name], female   DOB: 08/14/1945, 70 y.o.   MRN: [id number]  HPI This 70 year old female returns for continued follow-up regarding her left popliteal embolectomy performed 04/16/2015. Patient presented with bilateral pulmonary emboli and developed a severely ischemic left leg. She had trans-popliteal embolectomy with embolectomy of all tibial vessels and ultimately has had an excellent result from a circulation standpoint. It was felt that the embolus had possibly traversed a patent foramen ovale and  Entered the arterial system and lodged in the left popliteal artery. Patient is being evaluated by Dr. Verdis Prime for coronary artery disease and has a nuclear stress test ordered in the near future. Patient is on Xarelto. She denies any claudication symptoms. She has  Had some left leg edema which is improving she states. She occasionally has some numbness in the lateral aspect of her lower leg and her toes.  Past Medical History  Diagnosis Date  . Hypertension   . Arthritis of knee     Social History  Substance Use Topics  . Smoking status: Never Smoker   . Smokeless tobacco: Never Used  . Alcohol Use: No    Family History  Problem Relation Age of Onset  . Hypertension Father   . Diabetes Brother   . Healthy Mother     No Known Allergies   Current outpatient prescriptions:  .  metoprolol tartrate (LOPRESSOR) 25 MG tablet, Take 0.5 tablets (12.5 mg total) by mouth 2 (two) times daily., Disp: 60 tablet, Rfl: 0 .  oxyCODONE (OXY IR/ROXICODONE) 5 MG immediate release tablet, Take 1 tablet (5 mg total) by mouth every 4 (four) hours as needed for moderate pain., Disp: 30 tablet, Rfl: 0 .  rivaroxaban (XARELTO) 20 MG TABS tablet, Take 1 tablet (20 mg total) by mouth daily with supper., Disp: 30 tablet, Rfl: 3  Filed Vitals:   08/03/15 1342 08/03/15 1343  BP: 189/87 180/87  Pulse: 65   Temp: 98 F (36.7 C)   Resp: 16   Height:  (1.626 m)    Weight: 233 lb (105.688 kg)   SpO2: 94%     Body mass index is 39.97 kg/(m^2).           Review of Systems Denies active chest pain but has had a history of some chest discomfort radiating into the neck and his undergoing cardiology evaluation-see history of present illness. Denies dyspnea on exertion, hemoptysis, lateralizing weakness, aphasia. Other systems negative and complete review of systems     Objective:   Physical Exam BP 180/87 mmHg  Pulse 65  Temp(Src) 98 F (36.7 C)  Resp 16  Ht  (1.626 m)  Wt 233 lb (105.688 kg)  BMI 39.97 kg/m2  SpO2 94%  Gen.-alert and oriented x3 in no apparent distress HEENT normal for age Lungs no rhonchi or wheezing Cardiovascular regular rhythm no murmurs carotid pulses 3+ palpable no bruits audible Abdomen soft nontender no palpable masses Musculoskeletal free of  major deformities Skin clear -no rashes Neurologic normal Lower extremities 3+ femoral and dorsalis pedis pulses palpable bilaterally with no edema on the right 1+ edema below the knee on the left  Both feet adequately perfused Left popliteal embolectomy wound well-healed    today I ordered ABIs which I reviewed and interpreted. 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

## 2015-08-03 NOTE — Telephone Encounter (Signed)
Patient given detailed instructions per Myocardial Perfusion Study Information Sheet for the test on 08/04/2015 at 12:45. Patient notified to arrive 15 minutes early and that it is imperative to arrive on time for appointment to keep from having the test rescheduled.  If you need to cancel or reschedule your appointment, please call the office within 24 hours of your appointment. Failure to do so may result in a cancellation of your appointment, and a $50 no show fee. Patient verbalized understanding.EK

## 2015-08-04 ENCOUNTER — Ambulatory Visit (HOSPITAL_COMMUNITY): Payer: Medicare Other | Attending: Cardiovascular Disease

## 2015-08-04 ENCOUNTER — Other Ambulatory Visit: Payer: Self-pay

## 2015-08-04 ENCOUNTER — Ambulatory Visit (HOSPITAL_BASED_OUTPATIENT_CLINIC_OR_DEPARTMENT_OTHER): Payer: Medicare Other

## 2015-08-04 DIAGNOSIS — I110 Hypertensive heart disease with heart failure: Secondary | ICD-10-CM | POA: Diagnosis not present

## 2015-08-04 DIAGNOSIS — I509 Heart failure, unspecified: Secondary | ICD-10-CM | POA: Insufficient documentation

## 2015-08-04 DIAGNOSIS — R0789 Other chest pain: Secondary | ICD-10-CM | POA: Diagnosis not present

## 2015-08-04 DIAGNOSIS — I50811 Acute right heart failure: Secondary | ICD-10-CM

## 2015-08-04 MED ORDER — REGADENOSON 0.4 MG/5ML IV SOLN
0.4000 mg | Freq: Once | INTRAVENOUS | Status: AC
  Administered 2015-08-04: 0.4 mg via INTRAVENOUS

## 2015-08-04 MED ORDER — TECHNETIUM TC 99M SESTAMIBI GENERIC - CARDIOLITE
31.5000 | Freq: Once | INTRAVENOUS | Status: AC | PRN
  Administered 2015-08-04: 32 via INTRAVENOUS

## 2015-08-05 ENCOUNTER — Ambulatory Visit (HOSPITAL_COMMUNITY): Payer: Medicare Other | Attending: Cardiovascular Disease

## 2015-08-05 LAB — MYOCARDIAL PERFUSION IMAGING
CHL CUP NUCLEAR SDS: 4
CHL CUP NUCLEAR SRS: 2
CSEPPHR: 83 {beats}/min
LHR: 0.39
LV sys vol: 37 mL
LVDIAVOL: 92 mL (ref 46–106)
Rest HR: 60 {beats}/min
SSS: 6
TID: 0.89

## 2015-08-05 MED ORDER — TECHNETIUM TC 99M SESTAMIBI GENERIC - CARDIOLITE
30.7000 | Freq: Once | INTRAVENOUS | Status: AC | PRN
  Administered 2015-08-05: 30.7 via INTRAVENOUS

## 2015-08-30 ENCOUNTER — Ambulatory Visit (INDEPENDENT_AMBULATORY_CARE_PROVIDER_SITE_OTHER): Payer: Medicare Other | Admitting: Interventional Cardiology

## 2015-08-30 ENCOUNTER — Encounter: Payer: Self-pay | Admitting: Interventional Cardiology

## 2015-08-30 VITALS — BP 144/80 | HR 103 | Ht 63.0 in | Wt 232.0 lb

## 2015-08-30 DIAGNOSIS — R0789 Other chest pain: Secondary | ICD-10-CM

## 2015-08-30 DIAGNOSIS — I2699 Other pulmonary embolism without acute cor pulmonale: Secondary | ICD-10-CM | POA: Diagnosis not present

## 2015-08-30 DIAGNOSIS — I10 Essential (primary) hypertension: Secondary | ICD-10-CM | POA: Diagnosis not present

## 2015-08-30 DIAGNOSIS — I739 Peripheral vascular disease, unspecified: Secondary | ICD-10-CM | POA: Diagnosis not present

## 2015-08-30 NOTE — Patient Instructions (Signed)
Medication Instructions:  Your physician recommends that you continue on your current medications as directed. Please refer to the Current Medication list given to you today.   Labwork: None ordered  Testing/Procedures: None ordered  Follow-Up: Your physician wants you to follow-up in: 6 months with Dr.Smith You will receive a reminder letter in the mail two months in advance. If you don't receive a letter, please call our office to schedule the follow-up appointment.   Any Other Special Instructions Will Be Listed Below (If Applicable). Your physician discussed the importance of regular exercise and recommended that you start or continue a regular exercise program for good health.      If you need a refill on your cardiac medications before your next appointment, please call your pharmacy.

## 2015-08-30 NOTE — Progress Notes (Signed)
Cardiology Office Note   Date:  08/30/2015   ID:  Cassandra [name], DOB June 17, 1945, MRN [id number]  PCP:  Lynn ItoSmith, Lori, MD  Cardiologist:  Lesleigh NoeSMITH III,HENRY W, MD   Chief Complaint  Patient presents with  . Chest Pain      History of Present Illness: Cassandra [name] is a 70 y.o. female who presents for Paradoxical embolus to the left lower extremity at the time of pulmonary embolism. Underwent thrombectomy of the left popliteal with success. Still has paresthesias and aching in the left lower extremities but this is gradually improving.  Recent complaint of atypical chest pain evaluated by nuclear stress testing (low risk) and echocardiography (normal pulmonary systolic pressures). Since evaluation she feels better and has no specific complaints.    Past Medical History  Diagnosis Date  . Hypertension   . Arthritis of knee     Past Surgical History  Procedure Laterality Date  . Cholecystectomy    . Hemorrhoid surgery    . Breast surgery      lumpectomy on Right breast  . Embolectomy Left 04/16/2015    Procedure: Left LegTransPOPLITEAL EMBOLECTOMY of left iliac, left popliteal left anterior tibial, left tibial peroneal trunkPOSSIBLE FEMORAL EMBOLECTOMY;  Surgeon: Pryor OchoaJames D Lawson, MD;  [facility];  Service: Vascular;  Laterality: Left;  . Patch angioplasty Left 04/16/2015    Procedure: Bovine PATCH ANGIOPLASTY;  Surgeon: Pryor OchoaJames D Lawson, MD;  [facility];  Service: Vascular;  Laterality: Left;  . Hematoma evacuation Left 04/16/2015    Procedure: EVACUATION HEMATOMA FROM LEFT LEG POPLITEAL ARTERY EXPOSURE;  Surgeon: Pryor OchoaJames D Lawson, MD;  [facility];  Service: Vascular;  Laterality: Left;     Current Outpatient Prescriptions  Medication Sig Dispense Refill  . benazepril (LOTENSIN) 40 MG tablet Take 40 mg by mouth daily.  2  . hydrochlorothiazide (HYDRODIURIL) 25 MG tablet Take 25 mg by mouth daily.    Marland Kitchen. oxyCODONE (OXY IR/ROXICODONE) 5 MG immediate release tablet Take 1  tablet (5 mg total) by mouth every 4 (four) hours as needed for moderate pain. 30 tablet 0  . rivaroxaban (XARELTO) 20 MG TABS tablet Take 1 tablet (20 mg total) by mouth daily with supper. 30 tablet 3   No current facility-administered medications for this visit.    Allergies:   Review of patient's allergies indicates no known allergies.    Social History:  The patient  reports that she has never smoked. She has never used smokeless tobacco. She reports that she does not drink alcohol or use illicit drugs.   Family History:  The patient's family history includes Diabetes in her brother; Healthy in her mother; Hypertension in her father.    ROS:  Please see the history of present illness.   Otherwise, review of systems are positive for anxiety.   All other systems are reviewed and negative.    PHYSICAL EXAM: VS:  BP 144/80 mmHg  Pulse 103  Ht 5\' 3"  (1.6 m)  Wt 232 lb (105.235 kg)  BMI 41.11 kg/m2 , BMI Body mass index is 41.11 kg/(m^2). GEN: Well nourished, well developed, in no acute distress HEENT: normal Neck: no JVD, carotid bruits, or masses Cardiac: RRR.  There is no murmur, rub, or gallop. There is no edema. Respiratory:  clear to auscultation bilaterally, normal work of breathing. GI: soft, nontender, nondistended, + BS MS: no deformity or atrophy Skin: warm and dry, no rash Neuro:  Strength and sensation are intact Psych: euthymic mood,  full affect   EKG:  EKG not ordered today.    Recent Labs: 04/14/2015: Magnesium 1.7 04/19/2015: ALT 18 04/20/2015: BUN 8; Creatinine, Ser 1.12*; Hemoglobin 9.1*; Platelets 231; Potassium 4.0; Sodium 137    Lipid Panel No results found for: CHOL, TRIG, HDL, CHOLHDL, VLDL, LDLCALC, LDLDIRECT    Wt Readings from Last 3 Encounters:  08/30/15 232 lb (105.235 kg)  08/04/15 233 lb (105.688 kg)  08/03/15 233 lb (105.688 kg)      Other studies Reviewed: Additional studies/ records that were reviewed today include: Reviewed  findings of recent workup for atypical chest pain.. The findings include echo was essentially normal without evidence of pulmonary hypertension. Nuclear study was unremarkable for ischemia..    ASSESSMENT AND PLAN:  1. Other chest pain Resolved and recent nuclear workup did not reveal evidence of CAD  2. Essential hypertension Controlled  3. PAD (peripheral artery disease) (HCC) Lower extremity pain and dysesthesias or improving  4. PE (pulmonary embolism) Stable on chronic anticoagulation therapy    Current medicines are reviewed at length with the patient today.  The patient has the following concerns regarding medicines: None.  The following changes/actions have been instituted:    I encouraged aerobic activity  Labs/ tests ordered today include:  No orders of the defined types were placed in this encounter.     Disposition:   FU with HS in 6 months  Signed, Lesleigh Noe, MD  08/30/2015 10:18 AM    [facility] [address] [place], [place], [place]  [address] Phone: [phone]; Fax: [phone]

## 2015-11-16 ENCOUNTER — Encounter (INDEPENDENT_AMBULATORY_CARE_PROVIDER_SITE_OTHER): Payer: Medicare Other

## 2015-11-16 DIAGNOSIS — I868 Varicose veins of other specified sites: Secondary | ICD-10-CM

## 2016-03-23 ENCOUNTER — Encounter: Payer: Self-pay | Admitting: Interventional Cardiology

## 2016-03-23 ENCOUNTER — Ambulatory Visit (INDEPENDENT_AMBULATORY_CARE_PROVIDER_SITE_OTHER): Payer: Medicare Other | Admitting: Interventional Cardiology

## 2016-03-23 ENCOUNTER — Encounter (INDEPENDENT_AMBULATORY_CARE_PROVIDER_SITE_OTHER): Payer: Self-pay

## 2016-03-23 VITALS — BP 152/90 | HR 99 | Ht 64.0 in | Wt 229.8 lb

## 2016-03-23 DIAGNOSIS — I2782 Chronic pulmonary embolism: Secondary | ICD-10-CM | POA: Diagnosis not present

## 2016-03-23 DIAGNOSIS — I10 Essential (primary) hypertension: Secondary | ICD-10-CM

## 2016-03-23 DIAGNOSIS — I749 Embolism and thrombosis of unspecified artery: Secondary | ICD-10-CM

## 2016-03-23 NOTE — Patient Instructions (Signed)
Medication Instructions:  None  Labwork: None  Testing/Procedures: Your physician has requested that you have a carotid duplex. This test is an ultrasound of the carotid arteries in your neck. It looks at blood flow through these arteries that supply the brain with blood. Allow one hour for this exam. There are no restrictions or special instructions.   Follow-Up: Your physician wants you to follow-up in: 1 year with Dr. Smith.  You will receive a reminder letter in the mail two months in advance. If you don't receive a letter, please call our office to schedule the follow-up appointment.   Any Other Special Instructions Will Be Listed Below (If Applicable).     If you need a refill on your cardiac medications before your next appointment, please call your pharmacy.   

## 2016-03-23 NOTE — Progress Notes (Signed)
Cardiology Office Note    Date:  03/23/2016   ID:  Cassandra [name], Cassandra [name] 04/05/1946, MRN [id number]  PCP:  Angelica Chessman., MD  Cardiologist: Lesleigh Noe, MD   Chief Complaint  Patient presents with  . Chest Pain  . Follow-up    History of pulmonary emboli    History of Present Illness:  Cassandra [name] is a 70 y.o. female presents for Paradoxical embolus to the left lower extremity at the time of pulmonary embolism. Underwent thrombectomy of the left popliteal with success. Still has paresthesias and aching in the left lower extremities but this is gradually improving. Still on Xarelto.   Complains of left sided intermittent numbness. 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 Has occasional left lower extremity weakness when the numbness is present.  She has exertional fatigue and dyspnea. She has not had syncope. There is no orthopnea. She denies chest pain.  Past Medical History:  Diagnosis Date  . Arthritis of knee   . Hypertension     Past Surgical History:  Procedure Laterality Date  . BREAST SURGERY     lumpectomy on Right breast  . CHOLECYSTECTOMY    . EMBOLECTOMY Left 04/16/2015   Procedure: Left LegTransPOPLITEAL EMBOLECTOMY of left iliac, left popliteal left anterior tibial, left tibial peroneal trunkPOSSIBLE FEMORAL EMBOLECTOMY;  Surgeon: Pryor Ochoa, MD;  Location: [facility] OR;  Service: Vascular;  Laterality: Left;  . HEMATOMA EVACUATION Left 04/16/2015   Procedure: EVACUATION HEMATOMA FROM LEFT LEG POPLITEAL ARTERY EXPOSURE;  Surgeon: Pryor Ochoa, MD;  Location: [facility] OR;  Service: Vascular;  Laterality: Left;  . HEMORRHOID SURGERY    . PATCH ANGIOPLASTY Left 04/16/2015   Procedure: Bovine PATCH ANGIOPLASTY;  Surgeon: Pryor Ochoa, MD;  Location: [facility] OR;  Service: Vascular;  Laterality: Left;    Current Medications: Outpatient Medications Prior to Visit  Medication Sig Dispense Refill  . benazepril (LOTENSIN) 40 MG  tablet Take 40 mg by mouth daily.  2  . hydrochlorothiazide (HYDRODIURIL) 25 MG tablet Take 25 mg by mouth daily.    . rivaroxaban (XARELTO) 20 MG TABS tablet Take 1 tablet (20 mg total) by mouth daily with supper. 30 tablet 3  . oxyCODONE (OXY IR/ROXICODONE) 5 MG immediate release tablet Take 1 tablet (5 mg total) by mouth every 4 (four) hours as needed for moderate pain. (Patient not taking: Reported on 03/23/2016) 30 tablet 0   No facility-administered medications prior to visit.      Allergies:   Patient has no known allergies.   Social History   Social History  . Marital status: Divorced    Spouse name: N/A  . Number of children: N/A  . Years of education: N/A   Social History Main Topics  . Smoking status: Never Smoker  . Smokeless tobacco: Never Used  . Alcohol use No  . Drug use: No  . Sexual activity: Not Asked   Other Topics Concern  . None   Social History Narrative  . None     Family History:  The patient's family history includes Diabetes in her brother; Healthy in her mother; Hypertension in her father.   ROS:   Please see the history of present illness.    Recurring left leg pain and numbness. Occasional leg swelling.  All other systems reviewed and are negative.   PHYSICAL EXAM:   VS:  BP (!) 152/90   Pulse 99   Ht 5\' 4"  (1.626 m)  Wt 229 lb 12.8 oz (104.2 kg)   BMI 39.45 kg/m    GEN: Well nourished, well developed, in no acute distress . Moderate morbid obesity. HEENT: normal  Neck: no JVD, or masses. Faint left carotid bruit. Cardiac: RRR; no murmurs, rubs, or gallops,no edema  Respiratory:  clear to auscultation bilaterally, normal work of breathing GI: soft, nontender, nondistended, + BS MS: no deformity or atrophy  Skin: warm and dry, no rash Neuro:  Alert and Oriented x 3, Strength and sensation are intact Psych: euthymic mood, full affect  Wt Readings from Last 3 Encounters:  03/23/16 229 lb 12.8 oz (104.2 kg)  08/30/15 232 lb (105.2  kg)  08/04/15 233 lb (105.7 kg)      Studies/Labs Reviewed:   EKG:  EKG  Not performed.  Recent Labs: 04/14/2015: Magnesium 1.7 04/19/2015: ALT 18 04/20/2015: BUN 8; Creatinine, Ser 1.12; Hemoglobin 9.1; Platelets 231; Potassium 4.0; Sodium 137   Lipid Panel No results found for: CHOL, TRIG, HDL, CHOLHDL, VLDL, LDLCALC, LDLDIRECT  Additional studies/ records that were reviewed today include:  Reviewed the CT scans from December 2016 demonstrated paradoxical emboli to the lower extremity blood vessels. Multiple bilateral pulmonary emboli were noted on that study.  Repeat echo done in March 2017 revealed normal right heart size without evidence of cor pulmonale. Study Conclusions  - Left ventricle: The cavity size was normal. Wall thickness was   normal. Systolic function was normal. The estimated ejection   fraction was in the range of 60% to 65%. Wall motion was normal;   there were no regional wall motion abnormalities. Doppler   parameters are consistent with abnormal left ventricular   relaxation (grade 1 diastolic dysfunction). - Pulmonary arteries: Systolic pressure was mildly increased. PA   peak pressure: 32 mm Hg (S).     ASSESSMENT:    1. Paradoxical embolus (HCC)   2. Essential hypertension   3. Other chronic pulmonary embolism without acute cor pulmonale (HCC)      PLAN:  In order of problems listed above:  1. This occurred at the time of acute pulmonary embolism. She has persisting recurring left-sided symptoms. There is a faint left carotid bruit. We'll go ahead and do a carotid Doppler to exclude the possibility of fixed obstructive carotid disease. 2. 2 g sodium diet, aerobic activity, and weight loss or recommended. 3. One year out from multiple bilateral pulmonary emboli. We will need to determine at some point if anticoagulation therapy will be continued indefinitely or discontinued. She needs to have a hypercoagulability workup. She says that her  new primary physician Dr. Riley NearingAguiar is dealing with this now. I will defer to that workup. For the time being continue Xarelto.    Medication Adjustments/Labs and Tests Ordered: Current medicines are reviewed at length with the patient today.  Concerns regarding medicines are outlined above.  Medication changes, Labs and Tests ordered today are listed in the Patient Instructions below. Patient Instructions  Medication Instructions:  None  Labwork: None  Testing/Procedures: Your physician has requested that you have a carotid duplex. This test is an ultrasound of the carotid arteries in your neck. It looks at blood flow through these arteries that supply the brain with blood. Allow one hour for this exam. There are no restrictions or special instructions.   Follow-Up: Your physician wants you to follow-up in: 1 year with Dr. Katrinka BlazingSmith.  You will receive a reminder letter in the mail two months in advance. If you don't receive a letter,  please call our office to schedule the follow-up appointment.   Any Other Special Instructions Will Be Listed Below (If Applicable).     If you need a refill on your cardiac medications before your next appointment, please call your pharmacy.       Signed, Lesleigh NoeHenry W Smith III, MD  03/23/2016 11:44 AM    [facility] [address], [place], [place]  [address] Phone: [phone](336) [phone]; Fax: [phone]) [phone]

## 2016-03-28 ENCOUNTER — Ambulatory Visit (HOSPITAL_COMMUNITY)
Admission: RE | Admit: 2016-03-28 | Discharge: 2016-03-28 | Disposition: A | Discharge status: Home / Self Care | Payer: Medicare Other | Source: Ambulatory Visit | Attending: Cardiovascular Disease | Admitting: Cardiovascular Disease

## 2016-03-28 DIAGNOSIS — I749 Embolism and thrombosis of unspecified artery: Secondary | ICD-10-CM | POA: Insufficient documentation

## 2016-10-24 ENCOUNTER — Telehealth: Payer: Self-pay | Admitting: Vascular Surgery

## 2016-10-24 NOTE — Telephone Encounter (Signed)
Per the instructions from the staff message, I contacted the patient and scheduled an appointment for 12/08/16 at 9am for Cassandra [name]'s and to see Dr.Cain at 9:45am. I placed the pt on the wait list for a sooner appt. She voice understanding. awt

## 2016-10-24 NOTE — Telephone Encounter (Signed)
-----   Message from Sharee PimpleMarilyn K McChesney, RN sent at 10/24/2016  2:51 PM EDT ----- Regarding: RE: Triage Phone Call Contact: [phone] Don't know if anyone responded but I would get an ABI and see Randie Heinzain.   ----- Message ----- From: Shari Prowsobin, Annette W Sent: 10/23/2016   2:46 PM To: Sharee PimpleMarilyn K McChesney, RN, [facility] Clinical Pool Subject: Triage Phone Call                              Ladies, This patient called and left a message to schedule an appointment with Dr.Lawson. She was seen last by JDL on 08/03/15 for L popliteal embolectomy. And his office note indicates that she should follow up as needed. I told her JDL is retired and that I would call her back to schedule an appt. She states she is having numbness and pain in both legs with the R>L. I will schedule her to see Dr.Cain as a new vascular patient but will she need any vascular studies? Please advise. Drinda ButtsAnnette

## 2016-11-27 ENCOUNTER — Encounter: Payer: Self-pay | Admitting: Vascular Surgery

## 2016-11-30 ENCOUNTER — Other Ambulatory Visit: Payer: Self-pay

## 2016-11-30 DIAGNOSIS — M79605 Pain in left leg: Principal | ICD-10-CM

## 2016-11-30 DIAGNOSIS — M79604 Pain in right leg: Secondary | ICD-10-CM

## 2016-12-08 ENCOUNTER — Ambulatory Visit (INDEPENDENT_AMBULATORY_CARE_PROVIDER_SITE_OTHER): Payer: Medicare Other | Admitting: Vascular Surgery

## 2016-12-08 ENCOUNTER — Ambulatory Visit (HOSPITAL_COMMUNITY)
Admission: RE | Admit: 2016-12-08 | Discharge: 2016-12-08 | Disposition: A | Discharge status: Home / Self Care | Payer: Medicare Other | Source: Ambulatory Visit | Attending: Vascular Surgery | Admitting: Vascular Surgery

## 2016-12-08 ENCOUNTER — Encounter: Payer: Self-pay | Admitting: Vascular Surgery

## 2016-12-08 VITALS — BP 141/87 | HR 66 | Temp 97.5°F | Resp 16 | Ht 64.0 in | Wt 239.0 lb

## 2016-12-08 DIAGNOSIS — Z9889 Other specified postprocedural states: Secondary | ICD-10-CM

## 2016-12-08 DIAGNOSIS — R0989 Other specified symptoms and signs involving the circulatory and respiratory systems: Secondary | ICD-10-CM | POA: Diagnosis present

## 2016-12-08 DIAGNOSIS — M79604 Pain in right leg: Secondary | ICD-10-CM | POA: Insufficient documentation

## 2016-12-08 DIAGNOSIS — M79605 Pain in left leg: Secondary | ICD-10-CM | POA: Insufficient documentation

## 2016-12-08 DIAGNOSIS — I10 Essential (primary) hypertension: Secondary | ICD-10-CM | POA: Diagnosis not present

## 2016-12-08 NOTE — Progress Notes (Signed)
Patient ID: Cassandra [name], female   DOB: [birth year], 71 y.o.   MRN: [id number]  Reason for Consult: PAD (F/u.  ABI's.)   Referred by Angelica ChessmanAguiar, Rafaela M, MD  Subjective:     HPI:  Cassandra [name] is a 71 y.o. female with history of a left trans-popliteal embolectomy with tibial embolectomy for what was apparently DVT with PE and possible patent foramen ovale. She was last seen here in March 2017 by Dr. Hart RochesterLawson. She does have some significant residual edema as well as neuropathy. This is a level of mild to her at this time. She is wearing elastic compression stockings to the level of the knee which do seem to help. She is also interested in water aerobics. She is continued on Xarelto at this time.  Past Medical History:  Diagnosis Date  . Arthritis of knee   . Hypertension    Family History  Problem Relation Age of Onset  . Hypertension Father   . Diabetes Brother   . Healthy Mother    Past Surgical History:  Procedure Laterality Date  . BREAST SURGERY     lumpectomy on Right breast  . CHOLECYSTECTOMY    . EMBOLECTOMY Left 04/16/2015   Procedure: Left LegTransPOPLITEAL EMBOLECTOMY of left iliac, left popliteal left anterior tibial, left tibial peroneal trunkPOSSIBLE FEMORAL EMBOLECTOMY;  Surgeon: Pryor OchoaJames D Lawson, MD;  [facility];  Service: Vascular;  Laterality: Left;  . HEMATOMA EVACUATION Left 04/16/2015   Procedure: EVACUATION HEMATOMA FROM LEFT LEG POPLITEAL ARTERY EXPOSURE;  Surgeon: Pryor OchoaJames D Lawson, MD;  [facility];  Service: Vascular;  Laterality: Left;  . HEMORRHOID SURGERY    . PATCH ANGIOPLASTY Left 04/16/2015   Procedure: Bovine PATCH ANGIOPLASTY;  Surgeon: Pryor OchoaJames D Lawson, MD;  [facility];  Service: Vascular;  Laterality: Left;    Short Social History:  Social History  Substance Use Topics  . Smoking status: Never Smoker  . Smokeless tobacco: Never Used  . Alcohol use No    No Known Allergies  Current Outpatient Prescriptions  Medication Sig Dispense  Refill  . benazepril (LOTENSIN) 40 MG tablet Take 40 mg by mouth daily.  2  . hydrochlorothiazide (HYDRODIURIL) 25 MG tablet Take 25 mg by mouth daily.    . rivaroxaban (XARELTO) 20 MG TABS tablet Take 1 tablet (20 mg total) by mouth daily with supper. 30 tablet 3  . traMADol (ULTRAM) 50 MG tablet Take 50 mg by mouth every 8 (eight) hours as needed for pain.     No current facility-administered medications for this visit.     Review of Systems  Constitutional:  Constitutional negative. HENT: HENT negative.  Eyes: Eyes negative.  Respiratory: Respiratory negative.  Cardiovascular: Positive for claudication and leg swelling.  Musculoskeletal: Positive for leg pain.  Skin: Skin negative.  Neurological: Neurological negative. Hematologic: Hematologic/lymphatic negative.  Psychiatric: Psychiatric negative.        Objective:  Objective   Vitals:   12/08/16 0952  BP: (!) 141/87  Pulse: 66  Resp: 16  Temp: (!) 97.5 F (36.4 C)  TempSrc: Oral  SpO2: 97%  Weight: 239 lb (108.4 kg)  Height: 5\' 4"  (1.626 m)   Body mass index is 41.02 kg/m.  Physical Exam  Constitutional: She is oriented to person, place, and time. She appears well-developed.  HENT:  Head: Normocephalic.  Eyes: Pupils are equal, round, and reactive to light.  Neck: Normal range of motion.  Cardiovascular: Normal rate.   Pulses:  Dorsalis pedis pulses are 2+ on the right side, and 2+ on the left side.       Posterior tibial pulses are 2+ on the right side, and 2+ on the left side.  Abdominal: Soft. She exhibits no mass.  Musculoskeletal: Normal range of motion. She exhibits edema.  Neurological: She is alert and oriented to person, place, and time.  Skin: Skin is dry.  Psychiatric: She has a normal mood and affect. Her behavior is normal. Judgment and thought content normal.    Data: ABIs today are 1 bilaterally with triphasic waveforms.     Assessment/Plan:     71 year old female follows up from  previous left lower extremity of embolectomy. She is continued on Xarelto. She does have some residual swelling and neuropathy but is getting by okay with compression stockings. I have offered her venous testing but she is not terribly interested. She is however interested in water aerobics and I have discussed with her that this is a very good exercise for patients with leg swelling given the column of water pressure that supports return of fluid centrally. As her arterial system is free of disease she can follow-up on a when necessary basis. She demonstrates very good understanding.    Maeola HarmanBrandon Christopher Jabril Pursell MD [facility]

## 2017-03-26 NOTE — Progress Notes (Signed)
Cardiology Office Note    Date:  03/27/2017   ID:  Cassandra [name], DOB 09-Mar-1946, MRN [id number]  PCP:  Angelica ChessmanAguiar, Rafaela M, MD  Cardiologist: Lesleigh NoeHenry W Coulter Oldaker III, MD   Chief Complaint  Patient presents with  . Follow-up    Pulmonary emboli    History of Present Illness:  Cassandra [name] is a 71 y.o. female presents for Paradoxical embolus to the left lower extremity at the time of pulmonary embolism. Underwent thrombectomy of the left popliteal with success. Still has paresthesias and aching in the left lower extremities but this is gradually improving. Still on Xarelto.   The patient is doing well.  She has seen a hematologist, [facility] who suggested she remain on low-dose Xarelto for prophylaxis indefinitely.  She has had no recurrence of PE, lower extremity swelling, neurological complaints, bleeding on the current medical regimen, orthopnea, or other complaints.  She is concerned about her problem list which includes diagnoses such as CKD stage III, dilated cardiomyopathy, and restless leg syndrome.  I investigated these.  There is no evidence of dilated cardiomyopathy.  She does have mild renal insufficiency.  The restless leg diagnosis is uncertain.   Past Medical History:  Diagnosis Date  . Arthritis of knee   . Hypertension     Past Surgical History:  Procedure Laterality Date  . Bovine PATCH ANGIOPLASTY Left 04/16/2015   Performed by [facility]ryor [facility]choaLawson, James D[facility]  . BREAST SURGERY     lumpectomy on Right breast  . CHOLECYSTECTOMY    . EVACUATION HEMATOMA FROM LEFT LEG POPLITEAL ARTERY EXPOSURE Left 04/16/2015   Performed by [facility]ryor [facility]aLawson, James D[facility]  . HEMORRHOID SURGERY    . Left LegTransPOPLITEAL EMBOLECTOMY of left iliac, left popliteal left anterior tibial, left tibial peroneal trunkPOSSIBLE FEMORAL EMBOLECTOMY Left 04/16/2015   Performed by [facility]Lawson[facility] James D[facility]  . LOWER EXTREMITY ANGIOGRAM Left 04/16/2015     Per[facility] by [facility]Lawson[facility] D[facility]    Current Medications: Outpatient Medications Prior to Visit  Medication Sig Dispense Refill  . benazepril (LOTENSIN) 40 MG tablet Take 40 mg by mouth daily.  2  . hydrochlorothiazide (HYDRODIURIL) 25 MG tablet Take 25 mg by mouth daily.    . Rivaroxaban (XARELTO) 15 MG TABS tablet Take 15 mg daily with supper by mouth.    . rivaroxaban (XARELTO) 20 MG TABS tablet Take 1 tablet (20 mg total) by mouth daily with supper. 30 tablet 3  . traMADol (ULTRAM) 50 MG tablet Take 50 mg by mouth every 8 (eight) hours as needed for pain.     No facility-administered medications prior to visit.      Allergies:   Patient has no known allergies.   Social History   Socioeconomic History  . Marital status: Divorced    Spouse name: None  . Number of children: None  . Years of education: None  . Highest education level: None  Social Needs  . Financial resource strain: None  . Food insecurity - worry: None  . Food insecurity - inability: None  . Transportation needs - medical: None  . Transportation needs - non-medical: None  Occupational History  . None  Tobacco Use  . Smoking status: Never Smoker  . Smokeless tobacco: Never Used  Substance and Sexual Activity  . Alcohol use: No    Alcohol/week: 0.0 oz  . Drug use: No  .  Sexual activity: None  Other Topics Concern  . None  Social History Narrative  . None     Family History:  The patient's family history includes Diabetes in her brother; Healthy in her mother; Hypertension in her father.   ROS:   Please see the history of present illness.    Concerned about her problem list.  Has aching intermittently in the left lower extremity which is the side that she had the paradoxical embolic event. All other systems reviewed and are negative.   PHYSICAL EXAM:   VS:  BP (!) 152/80   Pulse 75   Ht 5\' 3"  (1.6 m)   Wt 239 lb 1.9 oz (108.5 kg)   BMI 42.36 kg/m    GEN: Well nourished, well  developed, in no acute distress  HEENT: normal  Neck: no JVD, carotid bruits, or masses Cardiac: RRR; no murmurs, rubs, or gallops,no edema  Respiratory:  clear to auscultation bilaterally, normal work of breathing GI: soft, nontender, nondistended, + BS MS: no deformity or atrophy  Skin: warm and dry, no rash Neuro:  Alert and Oriented x 3, Strength and sensation are intact Psych: euthymic mood, full affect  Wt Readings from Last 3 Encounters:  03/27/17 239 lb 1.9 oz (108.5 kg)  12/08/16 239 lb (108.4 kg)  03/23/16 229 lb 12.8 oz (104.2 kg)      Studies/Labs Reviewed:   EKG:  EKG low voltage, left anterior hemiblock, nonspecific T wave flattening, poor R wave progression, and when compared to prior tracings, no significant changes occurred.  Recent Labs: No results found for requested labs within last 8760 hours.   Lipid Panel No results found for: CHOL, TRIG, HDL, CHOLHDL, VLDL, LDLCALC, LDLDIRECT  Additional studies/ records that were reviewed today include:  None    ASSESSMENT:    1. Other chronic pulmonary embolism without acute cor pulmonale (HCC)   2. Pulmonary hypertension (HCC)   3. Paradoxical embolus (HCC)   4. Hyperglycemia      PLAN:  In order of problems listed above:  1. Prior history of acute pulmonary embolus with paradoxical clot to the left lower extremity.  She is on long-term anticoagulation.  Since that time pulmonary hypertension is resolved.  She has no cardiopulmonary effects.  She is now on long-term Xarelto 10 mg/day.  She has primary care and hematology follow-up.  From cardiac standpoint she is stable.  I will plan to see her again as needed in the future.  No change in her current medical regimen has been made.    Medication Adjustments/Labs and Tests Ordered: Current medicines are reviewed at length with the patient today.  Concerns regarding medicines are outlined above.  Medication changes, Labs and Tests ordered today are listed in  the Patient Instructions below. There are no Patient Instructions on file for this visit.   Signed, Lesleigh NoeHenry W Monaye Blackie III, MD  03/27/2017 9:37 AM    [facility] [address], [place], [place]  [address] Phone: [phone](336) [phone]; Fax: [phone]

## 2017-03-27 ENCOUNTER — Ambulatory Visit (INDEPENDENT_AMBULATORY_CARE_PROVIDER_SITE_OTHER): Payer: Medicare Other | Admitting: Interventional Cardiology

## 2017-03-27 ENCOUNTER — Encounter: Payer: Self-pay | Admitting: Interventional Cardiology

## 2017-03-27 VITALS — BP 152/80 | HR 75 | Ht 63.0 in | Wt 239.1 lb

## 2017-03-27 DIAGNOSIS — I749 Embolism and thrombosis of unspecified artery: Secondary | ICD-10-CM | POA: Diagnosis not present

## 2017-03-27 DIAGNOSIS — R739 Hyperglycemia, unspecified: Secondary | ICD-10-CM | POA: Diagnosis not present

## 2017-03-27 DIAGNOSIS — I2782 Chronic pulmonary embolism: Secondary | ICD-10-CM

## 2017-03-27 DIAGNOSIS — I272 Pulmonary hypertension, unspecified: Secondary | ICD-10-CM

## 2017-03-27 NOTE — Patient Instructions (Addendum)
Medication Instructions:  Your physician recommends that you continue on your current medications as directed. Please refer to the Current Medication list given to you today.  Labwork: None  Testing/Procedures: None  Follow-Up: Your physician recommends that you schedule a follow-up appointment as needed with Dr. Smith.     Any Other Special Instructions Will Be Listed Below (If Applicable).     If you need a refill on your cardiac medications before your next appointment, please call your pharmacy.   

## 2017-08-25 ENCOUNTER — Emergency Department (HOSPITAL_BASED_OUTPATIENT_CLINIC_OR_DEPARTMENT_OTHER): Payer: Medicare Other

## 2017-08-25 ENCOUNTER — Other Ambulatory Visit: Payer: Self-pay

## 2017-08-25 ENCOUNTER — Emergency Department (HOSPITAL_BASED_OUTPATIENT_CLINIC_OR_DEPARTMENT_OTHER)
Admission: EM | Admit: 2017-08-25 | Discharge: 2017-08-25 | Disposition: A | Discharge status: Home / Self Care | Payer: Medicare Other | Attending: Emergency Medicine | Admitting: Emergency Medicine

## 2017-08-25 ENCOUNTER — Encounter (HOSPITAL_BASED_OUTPATIENT_CLINIC_OR_DEPARTMENT_OTHER): Payer: Self-pay | Admitting: Emergency Medicine

## 2017-08-25 DIAGNOSIS — R51 Headache: Secondary | ICD-10-CM | POA: Diagnosis not present

## 2017-08-25 DIAGNOSIS — Z9049 Acquired absence of other specified parts of digestive tract: Secondary | ICD-10-CM | POA: Insufficient documentation

## 2017-08-25 DIAGNOSIS — Z79899 Other long term (current) drug therapy: Secondary | ICD-10-CM | POA: Diagnosis not present

## 2017-08-25 DIAGNOSIS — I10 Essential (primary) hypertension: Secondary | ICD-10-CM | POA: Insufficient documentation

## 2017-08-25 DIAGNOSIS — Z7901 Long term (current) use of anticoagulants: Secondary | ICD-10-CM | POA: Diagnosis not present

## 2017-08-25 DIAGNOSIS — N644 Mastodynia: Secondary | ICD-10-CM | POA: Diagnosis not present

## 2017-08-25 DIAGNOSIS — S2001XA Contusion of right breast, initial encounter: Secondary | ICD-10-CM

## 2017-08-25 DIAGNOSIS — Y9389 Activity, other specified: Secondary | ICD-10-CM | POA: Insufficient documentation

## 2017-08-25 DIAGNOSIS — Y998 Other external cause status: Secondary | ICD-10-CM | POA: Insufficient documentation

## 2017-08-25 DIAGNOSIS — S301XXA Contusion of abdominal wall, initial encounter: Secondary | ICD-10-CM | POA: Diagnosis not present

## 2017-08-25 DIAGNOSIS — Y92410 Unspecified street and highway as the place of occurrence of the external cause: Secondary | ICD-10-CM | POA: Diagnosis not present

## 2017-08-25 DIAGNOSIS — S3991XA Unspecified injury of abdomen, initial encounter: Secondary | ICD-10-CM | POA: Diagnosis present

## 2017-08-25 HISTORY — DX: Acute embolism and thrombosis of unspecified deep veins of unspecified lower extremity: I82.409

## 2017-08-25 HISTORY — DX: Other pulmonary embolism without acute cor pulmonale: I26.99

## 2017-08-25 LAB — COMPREHENSIVE METABOLIC PANEL
ALT: 19 U/L (ref 14–54)
AST: 39 U/L (ref 15–41)
Albumin: 3.9 g/dL (ref 3.5–5.0)
Alkaline Phosphatase: 72 U/L (ref 38–126)
Anion gap: 10 (ref 5–15)
BUN: 15 mg/dL (ref 6–20)
CO2: 21 mmol/L — ABNORMAL LOW (ref 22–32)
Calcium: 8.9 mg/dL (ref 8.9–10.3)
Chloride: 106 mmol/L (ref 101–111)
Creatinine, Ser: 1.27 mg/dL — ABNORMAL HIGH (ref 0.44–1.00)
GFR calc Af Amer: 48 mL/min — ABNORMAL LOW (ref >60)
GFR calc non Af Amer: 41 mL/min — ABNORMAL LOW (ref >60)
Glucose, Bld: 109 mg/dL — ABNORMAL HIGH (ref 65–99)
Potassium: 3.8 mmol/L (ref 3.5–5.1)
Sodium: 137 mmol/L (ref 135–145)
Total Bilirubin: 0.5 mg/dL (ref 0.3–1.2)
Total Protein: 7.6 g/dL (ref 6.5–8.1)

## 2017-08-25 LAB — CBC WITH DIFFERENTIAL/PLATELET
Basophils Absolute: 0 10*3/uL (ref 0.0–0.1)
Basophils Relative: 0 %
Eosinophils Absolute: 0.1 10*3/uL (ref 0.0–0.7)
Eosinophils Relative: 2 %
HCT: 34.7 % — ABNORMAL LOW (ref 36.0–46.0)
Hemoglobin: 12.1 g/dL (ref 12.0–15.0)
Lymphocytes Relative: 25 %
Lymphs Abs: 1.7 10*3/uL (ref 0.7–4.0)
MCH: 31.4 pg (ref 26.0–34.0)
MCHC: 34.9 g/dL (ref 30.0–36.0)
MCV: 90.1 fL (ref 78.0–100.0)
Monocytes Absolute: 0.6 10*3/uL (ref 0.1–1.0)
Monocytes Relative: 9 %
Neutro Abs: 4.4 10*3/uL (ref 1.7–7.7)
Neutrophils Relative %: 64 %
Platelets: 211 10*3/uL (ref 150–400)
RBC: 3.85 MIL/uL — ABNORMAL LOW (ref 3.87–5.11)
RDW: 13.3 % (ref 11.5–15.5)
WBC: 6.8 10*3/uL (ref 4.0–10.5)

## 2017-08-25 LAB — URINALYSIS, ROUTINE W REFLEX MICROSCOPIC
Bilirubin Urine: NEGATIVE
Glucose, UA: NEGATIVE mg/dL
Ketones, ur: NEGATIVE mg/dL
Nitrite: NEGATIVE
Protein, ur: NEGATIVE mg/dL
Specific Gravity, Urine: <1.005 — ABNORMAL LOW (ref 1.005–1.030)
pH: 6.5 (ref 5.0–8.0)

## 2017-08-25 LAB — URINALYSIS, MICROSCOPIC (REFLEX)

## 2017-08-25 MED ORDER — IOPAMIDOL (ISOVUE-300) INJECTION 61%
100.0000 mL | Freq: Once | INTRAVENOUS | Status: AC | PRN
  Administered 2017-08-25: 100 mL via INTRAVENOUS
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
## 2017-08-25 NOTE — ED Notes (Signed)
Patient transported to X-ray 

## 2017-08-25 NOTE — Discharge Instructions (Addendum)
Use ice on your bruises 3-4 times daily alternating 15 minutes on, 15 minutes off.  Take Tylenol as prescribed over-the-counter, as needed for your pain.  Please follow-up with your doctor in 4-5 days for recheck.  A urine culture will be sent and you will be called if antibiotics are necessary.  Please return to the emergency department if you develop any new or worsening symptoms.
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
## 2017-08-25 NOTE — ED Notes (Signed)
CT will need to wait for bun/creat to result prior to imaging with IV contrast as ordered, [facility]

## 2017-08-28 LAB — URINE CULTURE: Culture: >=100000 — AB

## 2017-08-29 ENCOUNTER — Telehealth: Payer: Self-pay | Admitting: Emergency Medicine

## 2017-08-29 NOTE — Telephone Encounter (Signed)
Post ED Visit - Positive Culture Follow-up  Culture report reviewed by antimicrobial stewardship pharmacist:  []  Enzo BiNathan Batchelder, Pharm.D. []  Celedonio MiyamotoJeremy Frens, Pharm.D., BCPS AQ-ID []  Garvin FilaMike Maccia, Pharm.D., BCPS []  Georgina PillionElizabeth Martin, Pharm.D., BCPS []  BajandasMinh Pham, [address]Pharm[address]D., BCPS, AAHIVP []  Estella HuskMichelle Turner, Pharm.D., BCPS, AAHIVP []  Lysle Pearlachel Rumbarger, PharmD, BCPS []  Blake DivineShannon Parkey, PharmD []  Pollyann SamplesAndy Johnston, PharmD, BCPS  Sharin MonsEmily Sinclair PharmD Positive urine culture Treated with none, asymptomatic, organism sensitive to the same and no further patient follow-up is required at this time.  Berle MullMiller, Dajuana Palen 08/29/2017, 2:03 PM
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
## 2017-10-01 ENCOUNTER — Other Ambulatory Visit: Payer: Self-pay

## 2017-10-01 ENCOUNTER — Emergency Department (HOSPITAL_BASED_OUTPATIENT_CLINIC_OR_DEPARTMENT_OTHER): Payer: Medicare Other

## 2017-10-01 ENCOUNTER — Emergency Department (HOSPITAL_BASED_OUTPATIENT_CLINIC_OR_DEPARTMENT_OTHER)
Admission: EM | Admit: 2017-10-01 | Discharge: 2017-10-01 | Disposition: A | Discharge status: Home / Self Care | Payer: Medicare Other | Attending: Emergency Medicine | Admitting: Emergency Medicine

## 2017-10-01 ENCOUNTER — Encounter (HOSPITAL_BASED_OUTPATIENT_CLINIC_OR_DEPARTMENT_OTHER): Payer: Self-pay

## 2017-10-01 DIAGNOSIS — N3090 Cystitis, unspecified without hematuria: Secondary | ICD-10-CM | POA: Insufficient documentation

## 2017-10-01 DIAGNOSIS — Z79899 Other long term (current) drug therapy: Secondary | ICD-10-CM | POA: Diagnosis not present

## 2017-10-01 DIAGNOSIS — R509 Fever, unspecified: Secondary | ICD-10-CM | POA: Insufficient documentation

## 2017-10-01 DIAGNOSIS — I129 Hypertensive chronic kidney disease with stage 1 through stage 4 chronic kidney disease, or unspecified chronic kidney disease: Secondary | ICD-10-CM | POA: Diagnosis not present

## 2017-10-01 DIAGNOSIS — R350 Frequency of micturition: Secondary | ICD-10-CM | POA: Diagnosis present

## 2017-10-01 DIAGNOSIS — N189 Chronic kidney disease, unspecified: Secondary | ICD-10-CM | POA: Insufficient documentation

## 2017-10-01 LAB — URINALYSIS, ROUTINE W REFLEX MICROSCOPIC
Bilirubin Urine: NEGATIVE
Glucose, UA: NEGATIVE mg/dL
Ketones, ur: NEGATIVE mg/dL
NITRITE: NEGATIVE
PROTEIN: NEGATIVE mg/dL
SPECIFIC GRAVITY, URINE: 1.015 (ref 1.005–1.030)
pH: 6 (ref 5.0–8.0)

## 2017-10-01 LAB — I-STAT CG4 LACTIC ACID, ED: LACTIC ACID, VENOUS: 1.36 mmol/L (ref 0.5–1.9)

## 2017-10-01 LAB — COMPREHENSIVE METABOLIC PANEL
ALT: 98 U/L — ABNORMAL HIGH (ref 14–54)
ANION GAP: 9 (ref 5–15)
AST: 195 U/L — ABNORMAL HIGH (ref 15–41)
Albumin: 3.6 g/dL (ref 3.5–5.0)
Alkaline Phosphatase: 122 U/L (ref 38–126)
BUN: 15 mg/dL (ref 6–20)
CALCIUM: 8.9 mg/dL (ref 8.9–10.3)
CHLORIDE: 101 mmol/L (ref 101–111)
CO2: 23 mmol/L (ref 22–32)
Creatinine, Ser: 1.23 mg/dL — ABNORMAL HIGH (ref 0.44–1.00)
GFR, EST AFRICAN AMERICAN: 50 mL/min — AB (ref >60)
GFR, EST NON AFRICAN AMERICAN: 43 mL/min — AB (ref >60)
Glucose, Bld: 115 mg/dL — ABNORMAL HIGH (ref 65–99)
Potassium: 4.1 mmol/L (ref 3.5–5.1)
SODIUM: 133 mmol/L — AB (ref 135–145)
Total Bilirubin: 1 mg/dL (ref 0.3–1.2)
Total Protein: 7.5 g/dL (ref 6.5–8.1)

## 2017-10-01 LAB — CBC WITH DIFFERENTIAL/PLATELET
BASOS ABS: 0 10*3/uL (ref 0.0–0.1)
Basophils Relative: 0 %
EOS ABS: 0.1 10*3/uL (ref 0.0–0.7)
EOS PCT: 2 %
HCT: 34.2 % — ABNORMAL LOW (ref 36.0–46.0)
HEMOGLOBIN: 12 g/dL (ref 12.0–15.0)
LYMPHS PCT: 5 %
Lymphs Abs: 0.3 10*3/uL — ABNORMAL LOW (ref 0.7–4.0)
MCH: 31.7 pg (ref 26.0–34.0)
MCHC: 35.1 g/dL (ref 30.0–36.0)
MCV: 90.2 fL (ref 78.0–100.0)
Monocytes Absolute: 0.3 10*3/uL (ref 0.1–1.0)
Monocytes Relative: 5 %
NEUTROS PCT: 88 %
Neutro Abs: 5.7 10*3/uL (ref 1.7–7.7)
PLATELETS: 187 10*3/uL (ref 150–400)
RBC: 3.79 MIL/uL — AB (ref 3.87–5.11)
RDW: 13.2 % (ref 11.5–15.5)
WBC: 6.4 10*3/uL (ref 4.0–10.5)

## 2017-10-01 LAB — URINALYSIS, MICROSCOPIC (REFLEX)

## 2017-10-01 LAB — LIPASE, BLOOD: LIPASE: 33 U/L (ref 11–51)

## 2017-10-01 MED ORDER — ACETAMINOPHEN 500 MG PO TABS
ORAL_TABLET | ORAL | Status: AC
  Filled 2017-10-01: qty 2

## 2017-10-01 MED ORDER — SODIUM CHLORIDE 0.9 % IV SOLN
1.0000 g | Freq: Once | INTRAVENOUS | Status: AC
  Administered 2017-10-01: 1 g via INTRAVENOUS
  Filled 2017-10-01: qty 10

## 2017-10-01 MED ORDER — SULFAMETHOXAZOLE-TRIMETHOPRIM 800-160 MG PO TABS: 1.0000 | ORAL_TABLET | Freq: Two times a day (BID) | ORAL | 0 refills | Status: AC

## 2017-10-01 MED ORDER — ACETAMINOPHEN 500 MG PO TABS
1000.0000 mg | ORAL_TABLET | Freq: Once | ORAL | Status: AC
  Administered 2017-10-01: 1000 mg via ORAL

## 2017-10-01 MED ORDER — CEPHALEXIN 500 MG PO CAPS: 1000.0000 mg | ORAL_CAPSULE | Freq: Two times a day (BID) | ORAL | 0 refills | Status: DC

## 2017-10-01 MED ORDER — ACETAMINOPHEN 500 MG PO TABS: 1000.0000 mg | ORAL_TABLET | Freq: Four times a day (QID) | ORAL | 0 refills | Status: DC | PRN

## 2017-10-01 NOTE — ED Notes (Signed)
ED Provider at bedside. 

## 2017-10-01 NOTE — ED Notes (Signed)
Patient transported to CT 

## 2017-10-01 NOTE — ED Provider Notes (Signed)
[facility] EMERGENCY DEPARTMENT Provider Note   CSN: [id number] Arrival date & time: 10/01/17  1602     History   Chief Complaint Chief Complaint  Patient presents with  . Urinary Frequency    HPI Cassandra [name] is a 72 y.o. female.  HPI 2 weeks ago patient was diagnosed with urinary tract infection by her primary care doctor.  She denies a previous to this she had history of recurrent urinary tract infection.  She reports one distant infection that was somewhat difficult to treat and then ultimately improved with Bactrim.  Patient reports she had been having urinary frequency and urgency.  She had been tried on Cipro and Macrobid.  Symptoms are not really improving.  Yesterday evening she reports she got a fever up to 103.  She reports she was starting to get some pain into her back as well.  No nausea vomiting or diarrhea.  Patient reports is been probably over 20 years since she is sexually active.  She denies any vaginal discharge or drainage.  No abnormal vaginal bleeding. Past Medical History:  Diagnosis Date  . Arthritis of knee   . DVT (deep venous thrombosis) (HCC)   . Hypertension   . Pulmonary embolism [facility])     Patient Active Problem List   Diagnosis Date Noted  . History of embolectomy 08/03/2015  . Chest pain 07/15/2015  . Pulmonary hypertension (HCC)   . Acute pulmonary embolism (HCC)   . Acute on chronic kidney failure (HCC)   . 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 Acute right heart failure (HCC) 04/14/2015  . Pulmonary embolism (HCC) 04/12/2015  . Hyperglycemia 04/12/2015  . AKI (acute kidney injury) (HCC) 04/12/2015  . Hypokalemia 04/12/2015  . Essential hypertension 04/12/2015  . Dependent edema 04/12/2015    Past Surgical History:  Procedure Laterality Date  . BREAST SURGERY     lumpectomy on Right breast  . CHOLECYSTECTOMY    . EMBOLECTOMY Left 04/16/2015   Procedure: Left LegTransPOPLITEAL EMBOLECTOMY of left iliac, left popliteal  left anterior tibial, left tibial peroneal trunkPOSSIBLE FEMORAL EMBOLECTOMY;  Surgeon: Pryor Ochoa, MD;  Location: [facility] OR;  Service: Vascular;  Laterality: Left;  . HEMATOMA EVACUATION Left 04/16/2015   Procedure: EVACUATION HEMATOMA FROM LEFT LEG POPLITEAL ARTERY EXPOSURE;  Surgeon: Pryor Ochoa, MD;  Location: [facility] OR;  Service: Vascular;  Laterality: Left;  . HEMORRHOID SURGERY    . PATCH ANGIOPLASTY Left 04/16/2015   Procedure: Bovine PATCH ANGIOPLASTY;  Surgeon: Pryor Ochoa, MD;  Location: [facility] OR;  Service: Vascular;  Laterality: Left;     OB History   None      Home Medications    Prior to Admission medications   Medication Sig Start Date End Date Taking? Authorizing Provider  nitrofurantoin, macrocrystal-monohydrate, (MACROBID) 100 MG capsule Take 100 mg by mouth 2 (two) times daily.   Yes [provider]  acetaminophen (TYLENOL) 500 MG tablet Take 2 tablets (1,000 mg total) by mouth every 6 (six) hours as needed. 10/01/17   Arby Barrette, MD  benazepril (LOTENSIN) 40 MG tablet Take 40 mg by mouth daily. 08/16/15   [provider]  cephALEXin (KEFLEX) 500 MG capsule Take 2 capsules (1,000 mg total) by mouth 2 (two) times daily. 10/01/17   Arby Barrette, MD  hydrochlorothiazide (HYDRODIURIL) 25 MG tablet Take 25 mg by mouth daily.    [provider]  rivaroxaban (XARELTO) 10 MG TABS tablet Take 10 mg daily by mouth.  [provider]  sulfamethoxazole-trimethoprim (BACTRIM DS,SEPTRA DS) 800-160 MG tablet Take 1 tablet by mouth 2 (two) times daily for 7 days. 10/01/17 10/08/17  Arby Barrette, MD    Family History Family History  Problem Relation Age of Onset  . Hypertension Father   . Diabetes Brother   . Healthy Mother     Social History Social History   Tobacco Use  . Smoking status: Never Smoker  . Smokeless tobacco: Never Used  Substance Use Topics  . Alcohol use: No    Alcohol/week: 0.0 oz  . Drug use: No     Allergies    Patient has no known allergies.   Review of Systems Review of Systems 10 Systems reviewed and are negative for acute change except as noted in the HPI.   Physical Exam Updated Vital Signs BP (!) 123/59 (BP Location: Right Arm)   Pulse 69   Temp (!) 101.2 F (38.4 C) (Oral)   Resp 16   Ht  (1.6 m)   Wt 111.1 kg (245 lb)   SpO2 99%   BMI 43.40 kg/m   Physical Exam  Constitutional: She is oriented to person, place, and time. She appears well-developed and well-nourished.  HENT:  Head: Normocephalic and atraumatic.  Mouth/Throat: Oropharynx is clear and moist.  Eyes: Pupils are equal, round, and reactive to light. EOM are normal.  Neck: Neck supple.  Cardiovascular: Normal rate, regular rhythm, normal heart sounds and intact distal pulses.  Pulmonary/Chest: Effort normal and breath sounds normal.  Abdominal: Soft. Bowel sounds are normal. She exhibits no distension. There is tenderness.  Suprapubic diffuse discomfort to palpation.  No guarding.  Low back mildly and diffusely tender to palpation.  Patient is moving without difficulty.  She sits stands and ambulates without limitation.  Musculoskeletal: Normal range of motion. She exhibits no edema.  Calves are soft and nontender.  Patient is ambulatory without difficulty.  She is able to move from chair to stretcher and to the bathroom without any assistance or instability.  Neurological: She is alert and oriented to person, place, and time. She has normal strength. Coordination normal. GCS eye subscore is 4. GCS verbal subscore is 5. GCS motor subscore is 6.  Skin: Skin is warm, dry and intact.  Psychiatric: She has a normal mood and affect.     ED Treatments / Results  Labs (all labs ordered are listed, but only abnormal results are displayed) Labs Reviewed  URINALYSIS, ROUTINE W REFLEX MICROSCOPIC - Abnormal; Notable for the following components:      Result Value   APPearance CLOUDY (*)    Hgb urine dipstick  MODERATE (*)    Leukocytes, UA LARGE (*)    All other components within normal limits  URINALYSIS, MICROSCOPIC (REFLEX) - Abnormal; Notable for the following components:   Bacteria, UA MANY (*)    All other components within normal limits  COMPREHENSIVE METABOLIC PANEL - Abnormal; Notable for the following components:   Sodium 133 (*)    Glucose, Bld 115 (*)    Creatinine, Ser 1.23 (*)    AST 195 (*)    ALT 98 (*)    GFR calc non Af Amer 43 (*)    GFR calc Af Amer 50 (*)    All other components within normal limits  CBC WITH DIFFERENTIAL/PLATELET - Abnormal; Notable for the following components:   RBC 3.79 (*)    HCT 34.2 (*)    Lymphs Abs 0.3 (*)    All  other components within normal limits  URINE CULTURE  CULTURE, BLOOD (ROUTINE X 2)  CULTURE, BLOOD (ROUTINE X 2)  LIPASE, BLOOD  I-STAT CG4 LACTIC ACID, ED  I-STAT CG4 LACTIC ACID, ED    EKG None  Radiology Ct Renal Stone Study  Result Date: 10/01/2017 CLINICAL DATA:  Right flank pain, gross hematuria. EXAM: CT ABDOMEN AND PELVIS WITHOUT CONTRAST TECHNIQUE: Multidetector CT imaging of the abdomen and pelvis was performed following the standard protocol without IV contrast. COMPARISON:  CT scan of August 25, 2017. FINDINGS: Lower chest: No acute abnormality. Hepatobiliary: Status post cholecystectomy. Stable calcifications are noted along inferior portion of right hepatic lobe. No biliary dilatation is noted. Pancreas: Unremarkable. No pancreatic ductal dilatation or surrounding inflammatory changes. Spleen: Normal in size without focal abnormality. Adrenals/Urinary Tract: Adrenal glands appear normal. Stable bilateral renal cysts are noted. No hydronephrosis or renal obstruction is noted. No renal or ureteral calculi are noted. Urinary bladder is unremarkable. Stomach/Bowel: Stomach is within normal limits. Appendix appears normal. No evidence of bowel wall thickening, distention, or inflammatory changes. Vascular/Lymphatic: No  significant vascular findings are present. No enlarged abdominal or pelvic lymph nodes. Reproductive: Uterus and bilateral adnexa are unremarkable. Other: No abdominal wall hernia or abnormality. No abdominopelvic ascites. Musculoskeletal: No acute or significant osseous findings. IMPRESSION: Stable bilateral renal cysts. No hydronephrosis or renal obstruction is noted. No renal or ureteral calculi are noted. No acute abnormality seen in the abdomen or pelvis. Electronically Signed   By: Lupita Raider, M.D.   On: 10/01/2017 17:55    Procedures Procedures (including critical care time)  Medications Ordered in ED Medications  acetaminophen (TYLENOL) 500 MG tablet (has no administration in time range)  acetaminophen (TYLENOL) tablet 1,000 mg (1,000 mg Oral Given 10/01/17 1626)  cefTRIAXone (ROCEPHIN) 1 g in sodium chloride 0.9 % 100 mL IVPB (0 g Intravenous Stopped 10/01/17 2047)     Initial Impression / Assessment and Plan / ED Course  I have reviewed the triage vital signs and the nursing notes.  Pertinent labs & imaging results that were available during my care of the patient were reviewed by me and considered in my medical decision making (see chart for details).      Final Clinical Impressions(s) / ED Diagnoses   Final diagnoses:  Cystitis  Fever, unspecified fever cause   Patient has had UTI symptoms and been treated with Macrobid and Cipro without improvement.  She developed fever last night.  She reported today also noting some back pain.  Urinalysis is still positive.  CT obtained to rule out other etiology or retained stone or pyelonephritis.  No remarkable findings on CT scan.  Clinically, patient remains well in appearance.  She is nontoxic and alert.  She is independently mobile without difficulty aside from some degree of obesity and body habitus and age, but this does not impede her ability to rest, get up and down from chair stretcher and to the bathroom walking  independently.  Her mental status is clear and alert.  At this time, will broaden coverage with Keflex and Bactrim and obtaining cultures.  Patient is clinically stable for discharge.  Follow-up plan and return precautions reviewed. ED Discharge Orders        Ordered    sulfamethoxazole-trimethoprim (BACTRIM DS,SEPTRA DS) 800-160 MG tablet  2 times daily     10/01/17 2107    cephALEXin (KEFLEX) 500 MG capsule  2 times daily     10/01/17 2107    acetaminophen (TYLENOL)  500 MG tablet  Every 6 hours PRN     10/01/17 2107       Arby Barrette, MD 10/04/17 [phone]

## 2017-10-01 NOTE — ED Triage Notes (Addendum)
C/o urinary freq, dysuria-sx started 2 weeks-states she was seen by PCP-given 2 different abx with no relief-fever started last night-was 103 PTA-no meds-NAD-steady gait

## 2017-10-02 LAB — URINE CULTURE: CULTURE: NO GROWTH

## 2017-10-05 ENCOUNTER — Encounter (HOSPITAL_BASED_OUTPATIENT_CLINIC_OR_DEPARTMENT_OTHER): Payer: Self-pay | Admitting: *Deleted

## 2017-10-05 ENCOUNTER — Other Ambulatory Visit: Payer: Self-pay

## 2017-10-05 ENCOUNTER — Emergency Department (HOSPITAL_BASED_OUTPATIENT_CLINIC_OR_DEPARTMENT_OTHER)
Admission: EM | Admit: 2017-10-05 | Discharge: 2017-10-05 | Disposition: A | Discharge status: Home / Self Care | Payer: Medicare Other | Attending: Emergency Medicine | Admitting: Emergency Medicine

## 2017-10-05 DIAGNOSIS — Z7901 Long term (current) use of anticoagulants: Secondary | ICD-10-CM | POA: Insufficient documentation

## 2017-10-05 DIAGNOSIS — I10 Essential (primary) hypertension: Secondary | ICD-10-CM | POA: Insufficient documentation

## 2017-10-05 DIAGNOSIS — R51 Headache: Secondary | ICD-10-CM | POA: Insufficient documentation

## 2017-10-05 DIAGNOSIS — Z79899 Other long term (current) drug therapy: Secondary | ICD-10-CM | POA: Diagnosis not present

## 2017-10-05 DIAGNOSIS — R42 Dizziness and giddiness: Secondary | ICD-10-CM | POA: Insufficient documentation

## 2017-10-05 DIAGNOSIS — R519 Headache, unspecified: Secondary | ICD-10-CM

## 2017-10-05 MED ORDER — PROCHLORPERAZINE MALEATE 10 MG PO TABS: 10.0000 mg | ORAL_TABLET | Freq: Two times a day (BID) | ORAL | 0 refills | Status: DC | PRN

## 2017-10-05 MED ORDER — PROCHLORPERAZINE EDISYLATE 10 MG/2ML IJ SOLN
10.0000 mg | Freq: Once | INTRAMUSCULAR | Status: AC
  Administered 2017-10-05: 10 mg via INTRAVENOUS
  Filled 2017-10-05: qty 2

## 2017-10-05 MED ORDER — SODIUM CHLORIDE 0.9 % IV BOLUS
1000.0000 mL | Freq: Once | INTRAVENOUS | Status: AC
  Administered 2017-10-05: 1000 mL via INTRAVENOUS

## 2017-10-05 MED ORDER — DIPHENHYDRAMINE HCL 50 MG/ML IJ SOLN
25.0000 mg | Freq: Once | INTRAMUSCULAR | Status: AC
  Administered 2017-10-05: 25 mg via INTRAVENOUS
  Filled 2017-10-05: qty 1

## 2017-10-05 NOTE — Discharge Instructions (Addendum)
History and exam today are consistent with a complicated headache.  Given your reassuring exam we feel you are safe for discharge home now your headache is improved with medicines.  Please follow-up with your primary doctor and continue her outpatient regimen of UTI antibiotics.  If any symptoms change or worsen, please return to the nearest emergency department.

## 2017-10-05 NOTE — ED Provider Notes (Signed)
[facility] EMERGENCY DEPARTMENT Provider Note   CSN: [id number] Arrival date & time: 10/05/17  1052     History   Chief Complaint Chief Complaint  Patient presents with  . Headache    HPI Cassandra [name] is a 72 y.o. female.  The history is provided by the patient and medical records.  Headache   This is a new problem. The current episode started more than 2 days ago. The problem occurs every few hours. The problem has been gradually improving. The headache is associated with loud noise. The quality of the pain is described as dull. The pain is at a severity of 5/10. The pain is moderate. The pain does not radiate. Pertinent negatives include no fever, no malaise/fatigue, no chest pressure, no palpitations, no syncope, no shortness of breath, no nausea and no vomiting. She has tried nothing for the symptoms. The treatment provided no relief.    Past Medical History:  Diagnosis Date  . Arthritis of knee   . DVT (deep venous thrombosis) (HCC)   . Hypertension   . Pulmonary embolism [facility])     Patient Active Problem List   Diagnosis Date Noted  . History of embolectomy 08/03/2015  . Chest pain 07/15/2015  . Pulmonary hypertension (HCC)   . Acute pulmonary embolism (HCC)   . Acute on chronic kidney failure (HCC)   . 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 Acute right heart failure (HCC) 04/14/2015  . Pulmonary embolism (HCC) 04/12/2015  . Hyperglycemia 04/12/2015  . AKI (acute kidney injury) (HCC) 04/12/2015  . Hypokalemia 04/12/2015  . Essential hypertension 04/12/2015  . Dependent edema 04/12/2015    Past Surgical History:  Procedure Laterality Date  . BREAST SURGERY     lumpectomy on Right breast  . CHOLECYSTECTOMY    . EMBOLECTOMY Left 04/16/2015   Procedure: Left LegTransPOPLITEAL EMBOLECTOMY of left iliac, left popliteal left anterior tibial, left tibial peroneal trunkPOSSIBLE FEMORAL EMBOLECTOMY;  Surgeon: Pryor Ochoa, MD;  Location: [facility] OR;   Service: Vascular;  Laterality: Left;  . HEMATOMA EVACUATION Left 04/16/2015   Procedure: EVACUATION HEMATOMA FROM LEFT LEG POPLITEAL ARTERY EXPOSURE;  Surgeon: Pryor Ochoa, MD;  Location: [facility] OR;  Service: Vascular;  Laterality: Left;  . HEMORRHOID SURGERY    . PATCH ANGIOPLASTY Left 04/16/2015   Procedure: Bovine PATCH ANGIOPLASTY;  Surgeon: Pryor Ochoa, MD;  Location: [facility] OR;  Service: Vascular;  Laterality: Left;     OB History   None      Home Medications    Prior to Admission medications   Medication Sig Start Date End Date Taking? Authorizing Provider  acetaminophen (TYLENOL) 500 MG tablet Take 2 tablets (1,000 mg total) by mouth every 6 (six) hours as needed. 10/01/17   Arby Barrette, MD  benazepril (LOTENSIN) 40 MG tablet Take 40 mg by mouth daily. 08/16/15   [provider]  cephALEXin (KEFLEX) 500 MG capsule Take 2 capsules (1,000 mg total) by mouth 2 (two) times daily. 10/01/17   Arby Barrette, MD  hydrochlorothiazide (HYDRODIURIL) 25 MG tablet Take 25 mg by mouth daily.    [provider]  nitrofurantoin, macrocrystal-monohydrate, (MACROBID) 100 MG capsule Take 100 mg by mouth 2 (two) times daily.    [provider]  rivaroxaban (XARELTO) 10 MG TABS tablet Take 10 mg daily by mouth.    [provider]  sulfamethoxazole-trimethoprim (BACTRIM DS,SEPTRA DS) 800-160 MG tablet Take 1 tablet by mouth 2 (two) times daily for 7 days. 10/01/17 10/08/17  Arby Barrette, MD    Family History Family History  Problem Relation Age of Onset  . Hypertension Father   . Diabetes Brother   . Healthy Mother     Social History Social History   Tobacco Use  . Smoking status: Never Smoker  . Smokeless tobacco: Never Used  Substance Use Topics  . Alcohol use: No    Alcohol/week: 0.0 oz  . Drug use: No     Allergies   Patient has no known allergies.   Review of Systems Review of Systems  Constitutional: Negative for chills, diaphoresis,  fatigue, fever and malaise/fatigue.  HENT: Negative for congestion.   Eyes: Positive for visual disturbance (flashes, auras).  Respiratory: Negative for cough, chest tightness, shortness of breath and wheezing.   Cardiovascular: Negative for chest pain, palpitations, leg swelling and syncope.  Gastrointestinal: Negative for abdominal pain, constipation, diarrhea, nausea and vomiting.  Genitourinary: Negative for dysuria, flank pain and frequency.  Musculoskeletal: Negative for back pain, gait problem, neck pain and neck stiffness.  Skin: Negative for rash and wound.  Neurological: Positive for light-headedness and headaches. Negative for dizziness, tremors and weakness.  All other systems reviewed and are negative.    Physical Exam Updated Vital Signs BP 138/65   Pulse 70   Temp 99.2 F (37.3 C) (Oral)   Resp 18   Ht  (1.6 m)   Wt 111.1 kg (245 lb)   SpO2 94%   BMI 43.40 kg/m   Physical Exam  Constitutional: She is oriented to person, place, and time. She appears well-developed and well-nourished.  Non-toxic appearance. She does not appear ill. No distress.  HENT:  Head: Normocephalic and atraumatic.  Nose: Nose normal.  Mouth/Throat: Oropharynx is clear and moist. No oropharyngeal exudate.  Eyes: Pupils are equal, round, and reactive to light. Conjunctivae and EOM are normal.  Neck: Normal range of motion. Neck supple.  Cardiovascular: Normal rate and intact distal pulses.  No murmur heard. Pulmonary/Chest: Effort normal. No respiratory distress. She has no wheezes. She exhibits no tenderness.  Abdominal: Soft. There is no tenderness.  Musculoskeletal: Normal range of motion. She exhibits no edema or tenderness.  Lymphadenopathy:    She has no cervical adenopathy.  Neurological: She is alert and oriented to person, place, and time. No cranial nerve deficit or sensory deficit. She exhibits normal muscle tone. Coordination normal.  Skin: Skin is warm. Capillary refill  takes less than 2 seconds. No rash noted. She is not diaphoretic. No erythema.  Psychiatric: She has a normal mood and affect.  Nursing note and vitals reviewed.    ED Treatments / Results  Labs (all labs ordered are listed, but only abnormal results are displayed) Labs Reviewed - No data to display  EKG None  Radiology No results found.  Procedures Procedures (including critical care time)  Medications Ordered in ED Medications  sodium chloride 0.9 % bolus 1,000 mL (1,000 mLs Intravenous New Bag/Given 10/05/17 1220)  prochlorperazine (COMPAZINE) injection 10 mg (10 mg Intravenous Given 10/05/17 1220)  diphenhydrAMINE (BENADRYL) injection 25 mg (25 mg Intravenous Given 10/05/17 1220)     Initial Impression / Assessment and Plan / ED Course  I have reviewed the triage vital signs and the nursing notes.  Pertinent labs & imaging results that were available during my care of the patient were reviewed by me and considered in my medical decision making (see chart for details).     Cassandra [name] is a 72 y.o. female with a  past medical history significant for CKD, PE, hypertension, and currently on antibiotics for UTI who presents with headache and lightheadedness.  Patient reports that she was diagnosed last week with a urinary tract infection and then was switched 4 days ago to Bactrim and Keflex.  She reports that following this medication change she started having some headaches.  She reports that she has had a history of headache in the past with associated colored flashes and spots in her vision.  She says that she always has the symptoms with headaches and the visual symptoms are resolved.  She denies any double vision or persistent blurry vision.  She denies any numbness, tingling, or weakness of extremities.  She denies speech difficulty facial droop or dizziness.  She does report some mild lightheadedness when she tries to move around when she is having her headache.  She reports  phonophobia with the symptoms.  She says that her family has a history of migraines but she has not been diagnosed with this in the past.  On exam, patient had no focal neurologic deficits.  Extraocular movements intact with normal pupils.  Sensation intact throughout with normal coordination and strength in all extremities.  Lungs clear and chest nontender.  Abdomen nontender.  Vital signs reassuring on arrival.  Clinically I suspect patient has developed a headache with auras.  She is concerned that it may be related to her medications and she was initially concerned about the dosages but review of the patient's chart shows that she is on correct doses of the Keflex and Bactrim.  Patient says that her headache is moderate this time but not as bad as it was earlier.  Patient will be given a headache cocktail and reassess.  Clinically I do not think patient is septic or having any other concerning findings.  Do not feel patient needs repeat of her urine or laboratory testing as she is not having any of the symptoms.  If patient's headache is improved, anticipate discharge.  2:01 PM Patient reassessed and has near resolution of her headache.  Patient's headache is now 1 out of 10.  She denies any visual symptoms dizziness or lightheadedness.  She feels much better after fluids and medications.  Suspect comp located migraine causing her aura like vision flashes.  Do not feel patient is having a stroke or other emergent cause of her symptoms.  I feel patient is safe for discharge home for PCP follow-up and to continue her outpatient regimen of antibiotics for the UTI.  Patient given prescription for Compazine to help with nausea and headache.  Patient will follow-up with PCP.  Patient had no other questions or concerns and was discharged in good condition with improving symptoms.       Final Clinical Impressions(s) / ED Diagnoses   Final diagnoses:  Bad headache  Lightheadedness    ED Discharge  Orders        Ordered    prochlorperazine (COMPAZINE) 10 MG tablet  2 times daily PRN     10/05/17 1403      Clinical Impression: 1. Bad headache   2. Lightheadedness     Disposition: Discharge  Condition: Good  I have discussed the results, Dx and Tx plan with the pt(& family if present). He/she/they expressed understanding and agree(s) with the plan. Discharge instructions discussed at great length. Strict return precautions discussed and pt &/or family have verbalized understanding of the instructions. No further questions at time of discharge.    New Prescriptions  PROCHLORPERAZINE (COMPAZINE) 10 MG TABLET    Take 1 tablet (10 mg total) by mouth 2 (two) times daily as needed for nausea or vomiting.    Follow Up: Angelica Chessman, MD [address] [place] [address] [phone]     [facility] [place] [facility] [address] [id number] [address] [place] [address] [phone]       Demisha Nokes, Canary Brim, MD 10/05/17 1540

## 2017-10-05 NOTE — ED Triage Notes (Signed)
She was seen here 4 days ago for a UTI. Here today with headache and slight dizziness.

## 2017-10-06 LAB — CULTURE, BLOOD (ROUTINE X 2)
CULTURE: NO GROWTH
CULTURE: NO GROWTH
SPECIAL REQUESTS: ADEQUATE
Special Requests: ADEQUATE

## 2018-08-12 ENCOUNTER — Other Ambulatory Visit: Payer: Self-pay | Admitting: *Deleted

## 2018-08-12 DIAGNOSIS — M79604 Pain in right leg: Secondary | ICD-10-CM

## 2018-08-12 DIAGNOSIS — M79605 Pain in left leg: Principal | ICD-10-CM

## 2018-08-12 NOTE — Progress Notes (Signed)
Patient called and c/o increase in swelling and pain to left lower extremity over the past 2 weeks. She denies any change in color or temperature and is equal to right leg. She claims she has decrease sensation to feet x many years. She is taking Xarelto, but can not wear stockings due to swelling. Will arrange venous studies and office appointment.

## 2018-08-13 ENCOUNTER — Telehealth (HOSPITAL_COMMUNITY): Payer: Self-pay | Admitting: Rehabilitation

## 2018-08-13 NOTE — Telephone Encounter (Signed)
The above patient or their representative was contacted and gave the following answers to these questions:         Do you have any of the following symptoms? No  Fever                    Cough                   Shortness of breath  Do  you have any of the following other symptoms? No    muscle pain         vomiting,        diarrhea        rash         weakness        red eye        abdominal pain         bruising          bruising or bleeding              joint pain           severe headache    Have you been in contact with someone who was or has been sick in the past 2 weeks? No  Yes                 Unsure                         Unable to assess   Does the person that you were in contact with have any of the following symptoms? N/A  Cough         shortness of breath           muscle pain         vomiting,            diarrhea            rash            weakness           fever            red eye           abdominal pain           bruising  or  bleeding                joint pain                severe headache               Have you  or someone you have been in contact with traveled internationally in th last month? No        If yes, which countries? N/A   Have you  or someone you have been in contact with traveled outside [place] in th last month?  No       If yes, which state and city? N/A   COMMENTS OR ACTION PLAN FOR THIS PATIENT:          

## 2018-08-14 ENCOUNTER — Ambulatory Visit (HOSPITAL_COMMUNITY)
Admission: RE | Admit: 2018-08-14 | Discharge: 2018-08-14 | Disposition: A | Discharge status: Home / Self Care | Payer: Medicare Other | Source: Ambulatory Visit | Attending: Vascular Surgery | Admitting: Vascular Surgery

## 2018-08-14 ENCOUNTER — Encounter: Payer: Self-pay | Admitting: Vascular Surgery

## 2018-08-14 ENCOUNTER — Ambulatory Visit (INDEPENDENT_AMBULATORY_CARE_PROVIDER_SITE_OTHER): Payer: Medicare Other | Admitting: Vascular Surgery

## 2018-08-14 ENCOUNTER — Other Ambulatory Visit: Payer: Self-pay

## 2018-08-14 VITALS — BP 134/82 | HR 98 | Temp 99.0°F | Resp 20 | Ht 63.0 in | Wt 231.0 lb

## 2018-08-14 DIAGNOSIS — I872 Venous insufficiency (chronic) (peripheral): Secondary | ICD-10-CM

## 2018-08-14 DIAGNOSIS — I890 Lymphedema, not elsewhere classified: Secondary | ICD-10-CM

## 2018-08-14 DIAGNOSIS — M79604 Pain in right leg: Secondary | ICD-10-CM | POA: Diagnosis not present

## 2018-08-14 DIAGNOSIS — M79605 Pain in left leg: Secondary | ICD-10-CM | POA: Diagnosis not present

## 2018-08-14 DIAGNOSIS — I83813 Varicose veins of bilateral lower extremities with pain: Secondary | ICD-10-CM

## 2018-08-14 NOTE — Progress Notes (Signed)
Patient name: Cassandra [name] MRN: [id number] DOB: January 18, 1946 Sex: female  REASON FOR VISIT:   Follow-up  HPI:   Cassandra [name] is a pleasant 73 y.o. female who was seen by Dr. Randie Heinz on 12/09/2018 with peripheral vascular disease.  The patient had undergone a previous left popliteal embolectomy and tibial embolectomy.  The patient apparently had a DVT with pulmonary embolus and possible patent foramen ovale.  At that time she was having some mild edema.  She was on Xarelto.  Arterial Doppler studies at that time showed triphasic waveforms in both lower extremities.  Dr. Randie Heinz discussed venous testing but at that time she was not interested.  She was to follow-up as needed.  The patient was put on the schedule today because of pain and swelling in the left lower extremity which is been going on for 2 weeks.  The patient does denies any injury to her left leg.  She has not had any recent long travel or been immobilized for any reason.  She denies any history of claudication, rest pain, or nonhealing ulcers.  Past Medical History:  Diagnosis Date  . Arthritis of knee   . DVT (deep venous thrombosis) (HCC)   . Hypertension   . Pulmonary embolism (HCC)     Family History  Problem Relation Age of Onset  . Hypertension Father   . Diabetes Brother   . Healthy Mother     SOCIAL HISTORY: Social History   Tobacco Use  . Smoking status: Never Smoker  . Smokeless tobacco: Never Used  Substance Use Topics  . Alcohol use: No    Alcohol/week: 0.0 standard drinks    No Known Allergies  Current Outpatient Medications  Medication Sig Dispense Refill  . benazepril (LOTENSIN) 40 MG tablet Take 40 mg by mouth daily.  2  . hydrochlorothiazide (HYDRODIURIL) 25 MG tablet Take 25 mg by mouth daily.    . rivaroxaban (XARELTO) 10 MG TABS tablet Take 10 mg daily by mouth.     No current facility-administered medications for this visit.     REVIEW OF SYSTEMS:  [X]  denotes positive finding, [ ]   denotes negative finding Cardiac  Comments:  Chest pain or chest pressure:    Shortness of breath upon exertion:    Short of breath when lying flat:    Irregular heart rhythm:        Vascular    Pain in calf, thigh, or hip brought on by ambulation: x   Pain in feet at night that wakes you up from your sleep:     Blood clot in your veins:    Leg swelling:  x       Pulmonary    Oxygen at home:    Productive cough:     Wheezing:         Neurologic    Sudden weakness in arms or legs:     Sudden numbness in arms or legs:     Sudden onset of difficulty speaking or slurred speech:    Temporary loss of vision in one eye:     Problems with dizziness:         Gastrointestinal    Blood in stool:     Vomited blood:         Genitourinary    Burning when urinating:     Blood in urine:        Psychiatric    Major depression:         Hematologic  Bleeding problems:    Problems with blood clotting too easily:        Skin    Rashes or ulcers:        Constitutional    Fever or chills:     PHYSICAL EXAM:   Vitals:   08/14/18 1047  BP: 134/82  Pulse: 98  Resp: 20  Temp: 99 F (37.2 C)  SpO2: 95%  Weight: 231 lb (104.8 kg)  Height: 5\' 3"  (1.6 m)    GENERAL: The patient is a well-nourished female, in no acute distress. The vital signs are documented above. CARDIAC: There is a regular rate and rhythm.  VASCULAR: I do not detect carotid bruits. She has moderate bilateral lower extremity swelling which is more significant on the left side.  This is nonpitting edema.  I suspect she has lymphedema. She has palpable dorsalis pedis pulses bilaterally. PULMONARY: There is good air exchange bilaterally without wheezing or rales. ABDOMEN: Soft and non-tender with normal pitched bowel sounds.  MUSCULOSKELETAL: There are no major deformities or cyanosis. NEUROLOGIC: No focal weakness or paresthesias are detected. SKIN: There are no ulcers or rashes noted. PSYCHIATRIC: The patient  has a normal affect.  DATA:    VENOUS DUPLEX: I have independently interpreted her venous duplex scan today.  This was of the left lower extremity only.  On the left side there is no evidence of deep venous thrombosis or superficial venous thrombosis.  There is deep venous reflux involving the common femoral vein and popliteal vein.  There is reflux of the saphenofemoral junction and also some reflux in the great saphenous vein at the knee.  The vein is not especially dilated.  MEDICAL ISSUES:   LEFT LOWER EXTREMITY PAIN AND SWELLING: I reassured the patient that she has no evidence of DVT or superficial thrombophlebitis.  She does have some evidence of reflux in the deep venous system and saphenofemoral junction consistent with chronic venous insufficiency.  I think she also has an element of lymphedema.  We have discussed the importance of intermittent leg elevation the proper positioning for this.  She did get some 20 to 30 mmHg knee-high compression stockings today in the office.  I encouraged her to avoid prolonged sitting and standing.  We have discussed the importance of exercise.  We will see her back as needed.  Waverly Ferrarihristopher Debbe [facility] [phone]
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
## 2022-02-10 ENCOUNTER — Emergency Department (HOSPITAL_COMMUNITY)
Admission: EM | Admit: 2022-02-10 | Discharge: 2022-02-11 | Discharge status: Against Medical Advice | Payer: Medicare Other | Attending: Emergency Medicine | Admitting: Emergency Medicine

## 2022-02-10 ENCOUNTER — Other Ambulatory Visit: Payer: Self-pay

## 2022-02-10 ENCOUNTER — Encounter (HOSPITAL_COMMUNITY): Payer: Self-pay | Admitting: Emergency Medicine

## 2022-02-10 DIAGNOSIS — Z5321 Procedure and treatment not carried out due to patient leaving prior to being seen by health care provider: Secondary | ICD-10-CM | POA: Insufficient documentation

## 2022-02-10 DIAGNOSIS — Z7901 Long term (current) use of anticoagulants: Secondary | ICD-10-CM | POA: Insufficient documentation

## 2022-02-10 DIAGNOSIS — R1011 Right upper quadrant pain: Secondary | ICD-10-CM | POA: Insufficient documentation

## 2022-02-10 DIAGNOSIS — Z79899 Other long term (current) drug therapy: Secondary | ICD-10-CM | POA: Insufficient documentation

## 2022-02-10 DIAGNOSIS — R079 Chest pain, unspecified: Secondary | ICD-10-CM | POA: Insufficient documentation

## 2022-02-10 DIAGNOSIS — I10 Essential (primary) hypertension: Secondary | ICD-10-CM | POA: Diagnosis not present

## 2022-02-10 LAB — CBC
HCT: 36.2 % (ref 36.0–46.0)
Hemoglobin: 11.9 g/dL — ABNORMAL LOW (ref 12.0–15.0)
MCH: 31.5 pg (ref 26.0–34.0)
MCHC: 32.9 g/dL (ref 30.0–36.0)
MCV: 95.8 fL (ref 80.0–100.0)
Platelets: 229 10*3/uL (ref 150–400)
RBC: 3.78 MIL/uL — ABNORMAL LOW (ref 3.87–5.11)
RDW: 12.9 % (ref 11.5–15.5)
WBC: 6.7 10*3/uL (ref 4.0–10.5)
nRBC: 0 % (ref 0.0–0.2)

## 2022-02-10 LAB — COMPREHENSIVE METABOLIC PANEL
ALT: 13 U/L (ref 0–44)
AST: 29 U/L (ref 15–41)
Albumin: 3.7 g/dL (ref 3.5–5.0)
Alkaline Phosphatase: 57 U/L (ref 38–126)
Anion gap: 10 (ref 5–15)
BUN: 15 mg/dL (ref 8–23)
CO2: 24 mmol/L (ref 22–32)
Calcium: 9.6 mg/dL (ref 8.9–10.3)
Chloride: 101 mmol/L (ref 98–111)
Creatinine, Ser: 1.66 mg/dL — ABNORMAL HIGH (ref 0.44–1.00)
GFR, Estimated: 32 mL/min — ABNORMAL LOW (ref >60)
Glucose, Bld: 109 mg/dL — ABNORMAL HIGH (ref 70–99)
Potassium: 4.1 mmol/L (ref 3.5–5.1)
Sodium: 135 mmol/L (ref 135–145)
Total Bilirubin: 0.5 mg/dL (ref 0.3–1.2)
Total Protein: 7.6 g/dL (ref 6.5–8.1)

## 2022-02-10 LAB — LIPASE, BLOOD: Lipase: 28 U/L (ref 11–51)

## 2022-02-10 MED ORDER — PANTOPRAZOLE SODIUM 40 MG PO TBEC
40.0000 mg | DELAYED_RELEASE_TABLET | Freq: Every day | ORAL | Status: DC
  Administered 2022-02-10: 40 mg via ORAL
  Filled 2022-02-10: qty 1

## 2022-02-10 NOTE — ED Provider Triage Note (Signed)
Emergency Medicine Provider Triage Evaluation Note  LEITHA HYPPOLITE , a 76 y.o. female  was evaluated in triage.  Pt complains of abdominal discomfort in the right upper abdomen.  Patient states symptoms have been going on for few days.  She is having a lot of gas discomfort as well as burping and belching.  She feels like sometimes when she massages in that area she will start to burp.  She has not had any vomiting.  No diarrhea.  Patient denies any fever.  She has had a prior cholecystectomy  Review of Systems  Positive: Right upper quadrant abdominal discomfort Negative: No vomiting  Physical Exam  BP (!) 162/72 (BP Location: Right Arm)   Pulse 86   Temp 98.8 F (37.1 C) (Oral)   Resp 14   Ht 1.6 m (5\' 3" )   Wt 106.6 kg   SpO2 97%   BMI 41.63 kg/m  Gen:   Awake, no distress   Resp:  Normal effort  MSK:   Moves extremities without difficulty  Other:  No tenderness palpation in the abdomen, no rebound or guarding  Medical Decision Making  Medically screening exam initiated at 5:59 PM.  Appropriate orders placed.  TARSHA BLANDO was informed that the remainder of the evaluation will be completed by another provider, this initial triage assessment does not replace that evaluation, and the importance of remaining in the ED until their evaluation is complete.     Dorie Rank, MD 02/10/22 1800

## 2022-02-10 NOTE — ED Triage Notes (Signed)
Patient arrives ambulatory by POV c/o having a lot of gas and pressure to right upper quadrant and under right breast x 3 days. Reports constant burping and passing gas.

## 2022-02-11 ENCOUNTER — Emergency Department (HOSPITAL_BASED_OUTPATIENT_CLINIC_OR_DEPARTMENT_OTHER): Payer: Medicare Other

## 2022-02-11 ENCOUNTER — Other Ambulatory Visit: Payer: Self-pay

## 2022-02-11 ENCOUNTER — Encounter (HOSPITAL_BASED_OUTPATIENT_CLINIC_OR_DEPARTMENT_OTHER): Payer: Self-pay | Admitting: Emergency Medicine

## 2022-02-11 ENCOUNTER — Emergency Department (HOSPITAL_BASED_OUTPATIENT_CLINIC_OR_DEPARTMENT_OTHER)
Admission: EM | Admit: 2022-02-11 | Discharge: 2022-02-11 | Disposition: A | Discharge status: Home / Self Care | Payer: Medicare Other | Source: Home / Self Care | Attending: Emergency Medicine | Admitting: Emergency Medicine

## 2022-02-11 DIAGNOSIS — Z7901 Long term (current) use of anticoagulants: Secondary | ICD-10-CM | POA: Insufficient documentation

## 2022-02-11 DIAGNOSIS — I10 Essential (primary) hypertension: Secondary | ICD-10-CM | POA: Insufficient documentation

## 2022-02-11 DIAGNOSIS — R079 Chest pain, unspecified: Secondary | ICD-10-CM | POA: Insufficient documentation

## 2022-02-11 DIAGNOSIS — R1011 Right upper quadrant pain: Secondary | ICD-10-CM

## 2022-02-11 DIAGNOSIS — Z79899 Other long term (current) drug therapy: Secondary | ICD-10-CM | POA: Insufficient documentation

## 2022-02-11 LAB — URINALYSIS, ROUTINE W REFLEX MICROSCOPIC
Bilirubin Urine: NEGATIVE
Glucose, UA: NEGATIVE mg/dL
Ketones, ur: NEGATIVE mg/dL
Leukocytes,Ua: NEGATIVE
Nitrite: NEGATIVE
Protein, ur: NEGATIVE mg/dL
Specific Gravity, Urine: 1.01 (ref 1.005–1.030)
pH: 7 (ref 5.0–8.0)

## 2022-02-11 LAB — URINALYSIS, MICROSCOPIC (REFLEX)

## 2022-02-11 LAB — TROPONIN I (HIGH SENSITIVITY): Troponin I (High Sensitivity): 6 ng/L (ref <18)

## 2022-02-11 MED ORDER — IOHEXOL 350 MG/ML SOLN
60.0000 mL | Freq: Once | INTRAVENOUS | Status: AC | PRN
  Administered 2022-02-11: 60 mL via INTRAVENOUS

## 2022-02-11 MED ORDER — SODIUM CHLORIDE 0.9 % IV BOLUS
500.0000 mL | Freq: Once | INTRAVENOUS | Status: AC
  Administered 2022-02-11: 500 mL via INTRAVENOUS

## 2022-02-11 NOTE — ED Provider Notes (Signed)
[facility] EMERGENCY DEPARTMENT Provider Note   CSN: [id number] Arrival date & time: 02/11/22  9528     History  Chief Complaint  Patient presents with   Abdominal Pain    Cassandra [name] is a 76 y.o. female.  Patient is a 76 year old female with a past medical history of hypertension, PE and DVT on Xarelto, cholecystectomy presenting to the emergency department with right lower chest/right upper quadrant pain.  Patient states that she has had pain ongoing for the last 3 days.  She states that her pain comes and goes and is unsure what brings it on.  She states that it improves with belching or with massaging her right lower chest.  She states that this feels similar to when she was diagnosed with her blood clot.  She denies any associated shortness of breath.  She states she did take her Xarelto last night but otherwise has had no missed doses.  She denies any recent hospitalizations or surgeries.  She is on clear what caused her PE to start.  She denies any nausea or vomiting, diarrhea or constipation, fevers or chills, dysuria or hematuria.  States she was seen by her primary doctor who was concerned that she may have a new PE and recommended that she come to the emergency department for evaluation.  Of note, she was seen in the [facility] ED waiting room last night and had triage labs performed but left prior to being evaluated in her room.  The history is provided by the patient.  Abdominal Pain      Home Medications Prior to Admission medications   Medication Sig Start Date End Date Taking? Authorizing Provider  benazepril (LOTENSIN) 40 MG tablet Take 40 mg by mouth daily. 08/16/15   [provider]  hydrochlorothiazide (HYDRODIURIL) 25 MG tablet Take 25 mg by mouth daily.    [provider]  rivaroxaban (XARELTO) 10 MG TABS tablet Take 10 mg daily by mouth.    [provider]      Allergies    Patient has no known allergies.    Review of Systems    Review of Systems  Gastrointestinal:  Positive for abdominal pain.    Physical Exam Updated Vital Signs BP 134/68   Pulse 64   Temp 98.5 F (36.9 C)   Resp 17   Ht 5\' 3"  (1.6 m)   Wt 106.6 kg   SpO2 99%   BMI 41.63 kg/m  Physical Exam Vitals and nursing note reviewed.  Constitutional:      General: She is not in acute distress.    Appearance: She is well-developed. She is obese.  HENT:     Head: Normocephalic and atraumatic.     Mouth/Throat:     Mouth: Mucous membranes are moist.     Pharynx: Oropharynx is clear.  Eyes:     Extraocular Movements: Extraocular movements intact.  Cardiovascular:     Rate and Rhythm: Normal rate and regular rhythm.     Heart sounds: Normal heart sounds.     Comments: No chest wall tenderness to palpation Pulmonary:     Effort: Pulmonary effort is normal.     Breath sounds: Normal breath sounds.  Abdominal:     General: Abdomen is flat.     Palpations: Abdomen is soft.     Tenderness: There is no abdominal tenderness. There is no right CVA tenderness or left CVA tenderness.  Skin:    General: Skin is warm and dry.  Findings: No rash.  Neurological:     General: No focal deficit present.     Mental Status: She is alert and oriented to person, place, and time.  Psychiatric:        Mood and Affect: Mood normal.        Behavior: Behavior normal.     ED Results / Procedures / Treatments   Labs (all labs ordered are listed, but only abnormal results are displayed) Labs Reviewed  URINALYSIS, ROUTINE W REFLEX MICROSCOPIC - Abnormal; Notable for the following components:      Result Value   APPearance CLOUDY (*)    Hgb urine dipstick TRACE (*)    All other components within normal limits  URINALYSIS, MICROSCOPIC (REFLEX) - Abnormal; Notable for the following components:   Bacteria, UA MANY (*)    All other components within normal limits  TROPONIN I (HIGH SENSITIVITY)    EKG EKG Interpretation  Date/Time:  Saturday  February 11 2022 10:03:17 EDT Ventricular Rate:  82 PR Interval:  168 QRS Duration: 128 QT Interval:  404 QTC Calculation: 472 R Axis:   -86 Text Interpretation: Normal sinus rhythm Right bundle branch block Left anterior fascicular block, Bifascicular block, Possible Lateral infarct , age undetermined Abnormal ECG  New bifascicular block compared to prior EKG Confirmed by Elayne Snare ([id number]) on 02/11/2022 10:10:18 AM  Radiology CT Angio Chest PE W and/or Wo Contrast  Result Date: 02/11/2022 CLINICAL DATA:  76 year old female with chest pain. Currently on blood thinners for prior pulmonary emboli. History of breast cancer. EXAM: CT ANGIOGRAPHY CHEST WITH CONTRAST TECHNIQUE: Multidetector CT imaging of the chest was performed using the standard protocol during bolus administration of intravenous contrast. Multiplanar CT image reconstructions and MIPs were obtained to evaluate the vascular anatomy. RADIATION DOSE REDUCTION: This exam was performed according to the departmental dose-optimization program which includes automated exposure control, adjustment of the mA and/or kV according to patient size and/or use of iterative reconstruction technique. CONTRAST:  73mL OMNIPAQUE IOHEXOL 350 MG/ML SOLN COMPARISON:  08/25/2017 CT and prior studies FINDINGS: Cardiovascular: This is a technically satisfactory study. No pulmonary emboli are identified. UPPER limits normal heart size again noted. There is no evidence of thoracic aortic aneurysm or pericardial effusion. Mediastinum/Nodes: No enlarged mediastinal, hilar, or axillary lymph nodes. Thyroid gland, trachea, and esophagus demonstrate no significant findings. Lungs/Pleura: There is no evidence of airspace disease, consolidation, mass, pleural effusion or pneumothorax. Mild dependent/bibasilar atelectasis noted. Upper Abdomen: No acute abnormality. Musculoskeletal: No acute or suspicious bony abnormalities are noted. Review of the MIP images confirms the  above findings. IMPRESSION: 1. No evidence of pulmonary emboli or thoracic aortic aneurysm. 2. Mild dependent/bibasilar atelectasis. Electronically Signed   By: Harmon Pier M.D.   On: 02/11/2022 11:24    Procedures Procedures    Medications Ordered in ED Medications  sodium chloride 0.9 % bolus 500 mL (0 mLs Intravenous Stopped 02/11/22 1158)  iohexol (OMNIPAQUE) 350 MG/ML injection 60 mL (60 mLs Intravenous Contrast Given 02/11/22 1059)    ED Course/ Medical Decision Making/ A&P Clinical Course as of 02/11/22 1158  Sat Feb 11, 2022  1012 EKG unchanged from Urgent care records reviewed from yesterday. [VK]  1151 Troponin is negative and EKG is unchanged from outpatient records making ACS less likely.  She did have bacteria in her urine but significant squames and denies any urinary symptoms to me making UTI or pyelonephritis unlikely.  Upon reassessment, the patient states that she is feeling well.  CT is  negative for PE or other acute abnormality to explain her pain.  Patient's symptoms may be secondary to gastritis or GERD and was recommended to follow-up with her primary doctor for symptom recheck.  She will be given strict return precautions. [VK]    Clinical Course User Index [VK] Kemper Durie, DO                           Medical Decision Making This patient presents to the ED with chief complaint(s) of right lower chest/abdominal pain with pertinent past medical history of PE on Xarelto, hypertension which further complicates the presenting complaint. The complaint involves an extensive differential diagnosis and also carries with it a high risk of complications and morbidity.    The differential diagnosis includes possible PE though patient has not missed any Xarelto and is not tachycardic, tachypneic or hypoxic, ACS, pneumonia or pleural effusion less likely as normal chest x-ray performed last night, she has had a cholecystectomy but possible biliary, hepatic or pancreatic  cause of pain, possible gastritis or GERD  Additional history obtained: Additional history obtained from N/A Records reviewed Primary Care Documents and triage note from [facility] ED last night  ED Course and Reassessment: Patient's labs performed at ED last night were reviewed by myself and showed no abnormality including normal LFTs and lipase.  She has no reproducible right upper quadrant pain and has had a cholecystectomy making hepatobiliary cause of her pain less likely.  Her primary doctor was highly concerned for possible recurrent PE as the symptoms are similar to her prior PE and she will have CT PE performed.  EKG and troponin will be performed to evaluate for ACS as a cause of her symptoms.  She is declining any pain medication at this time.  Independent labs interpretation:  The following labs were independently interpreted: Within normal range  Independent visualization of imaging: - I independently visualized the following imaging with scope of interpretation limited to determining acute life threatening conditions related to emergency care: CT PE, which revealed no acute disease  Consultation: - Consulted or discussed management/test interpretation w/ external professional: N/A  Consideration for admission or further workup: Patient has no emergent conditions requiring admission at this time and is stable for discharge home with primary care follow-up Social Determinants of health: N/A    Amount and/or Complexity of Data Reviewed Labs: ordered. Radiology: ordered.  Risk Prescription drug management.          Final Clinical Impression(s) / ED Diagnoses Final diagnoses:  Chest pain, unspecified type  RUQ pain    Rx / DC Orders ED Discharge Orders     None         Kemper Durie, DO 02/11/22 1158

## 2022-02-11 NOTE — Discharge Instructions (Signed)
The emergency department for your upper abdominal and lower chest pain.  Your labs last night showed normal liver, kidney and pancreas function.  Your labs today showed a normal heart enzyme and no signs of heart attack or abnormal heart rhythms on your EKG.  We did perform a CT scan that showed no signs of blood clots, fluid or pneumonia in your lungs.  It is unclear what is causing your symptoms but you may have gastritis or reflux.  You can take the Tums as needed for pain and you should follow-up with your primary doctor to have your symptoms rechecked.  You should return to the emergency department if you are having significantly worsening pain, worsening shortness of breath, repetitive vomiting, fevers or any other new or concerning symptoms.

## 2022-02-11 NOTE — ED Triage Notes (Signed)
Pt arrives pov, steady gait, c/o RUQ pain, burping and gas x 4 days, went to [facility] on 10/6, LWBS. Pt endorses thinners

## 2023-07-05 DIAGNOSIS — D6859 Other primary thrombophilia: Secondary | ICD-10-CM | POA: Diagnosis not present

## 2023-07-05 DIAGNOSIS — E538 Deficiency of other specified B group vitamins: Secondary | ICD-10-CM | POA: Diagnosis not present

## 2023-07-05 DIAGNOSIS — D649 Anemia, unspecified: Secondary | ICD-10-CM | POA: Diagnosis not present

## 2023-07-05 DIAGNOSIS — Z86711 Personal history of pulmonary embolism: Secondary | ICD-10-CM | POA: Diagnosis not present

## 2023-10-16 DIAGNOSIS — M25551 Pain in right hip: Secondary | ICD-10-CM | POA: Diagnosis not present

## 2023-10-16 DIAGNOSIS — I10 Essential (primary) hypertension: Secondary | ICD-10-CM | POA: Diagnosis not present

## 2023-12-24 DIAGNOSIS — I10 Essential (primary) hypertension: Secondary | ICD-10-CM | POA: Diagnosis not present

## 2023-12-24 DIAGNOSIS — N1832 Chronic kidney disease, stage 3b: Secondary | ICD-10-CM | POA: Diagnosis not present

## 2023-12-24 DIAGNOSIS — Z13220 Encounter for screening for lipoid disorders: Secondary | ICD-10-CM | POA: Diagnosis not present

## 2023-12-24 DIAGNOSIS — I129 Hypertensive chronic kidney disease with stage 1 through stage 4 chronic kidney disease, or unspecified chronic kidney disease: Secondary | ICD-10-CM | POA: Diagnosis not present

## 2023-12-24 DIAGNOSIS — M25551 Pain in right hip: Secondary | ICD-10-CM | POA: Diagnosis not present
